# Patient Record
Sex: Female | Born: 1955 | Race: White | Hispanic: No | Marital: Married | Smoking: Never smoker
Health system: Southern US, Community
[De-identification: ages and names within clinical notes are randomized; demographics above are authoritative.]

## PROBLEM LIST (undated history)

## (undated) DIAGNOSIS — M1990 Unspecified osteoarthritis, unspecified site: Secondary | ICD-10-CM

## (undated) DIAGNOSIS — S76119A Strain of unspecified quadriceps muscle, fascia and tendon, initial encounter: Secondary | ICD-10-CM

## (undated) DIAGNOSIS — H269 Unspecified cataract: Secondary | ICD-10-CM

## (undated) DIAGNOSIS — M279 Disease of jaws, unspecified: Secondary | ICD-10-CM

## (undated) HISTORY — PX: CERVICAL CONIZATION W/BX: SHX1330

## (undated) HISTORY — PX: TONSILLECTOMY: SUR1361

---

## 2000-11-06 DIAGNOSIS — C4491 Basal cell carcinoma of skin, unspecified: Secondary | ICD-10-CM

## 2000-11-06 HISTORY — DX: Basal cell carcinoma of skin, unspecified: C44.91

## 2011-08-01 ENCOUNTER — Ambulatory Visit: Payer: Self-pay | Admitting: Family Medicine

## 2012-02-19 ENCOUNTER — Other Ambulatory Visit: Payer: Self-pay | Admitting: Orthopedic Surgery

## 2012-02-20 ENCOUNTER — Encounter (HOSPITAL_BASED_OUTPATIENT_CLINIC_OR_DEPARTMENT_OTHER): Payer: Self-pay | Admitting: *Deleted

## 2012-02-23 ENCOUNTER — Encounter (HOSPITAL_BASED_OUTPATIENT_CLINIC_OR_DEPARTMENT_OTHER): Payer: Self-pay | Admitting: Anesthesiology

## 2012-02-23 ENCOUNTER — Ambulatory Visit (HOSPITAL_BASED_OUTPATIENT_CLINIC_OR_DEPARTMENT_OTHER): Payer: Worker's Compensation | Admitting: Anesthesiology

## 2012-02-23 ENCOUNTER — Ambulatory Visit (HOSPITAL_BASED_OUTPATIENT_CLINIC_OR_DEPARTMENT_OTHER)
Admission: RE | Admit: 2012-02-23 | Discharge: 2012-02-23 | Disposition: A | Discharge status: Home / Self Care | Payer: Worker's Compensation | Source: Ambulatory Visit | Attending: Orthopedic Surgery | Admitting: Orthopedic Surgery

## 2012-02-23 ENCOUNTER — Encounter (HOSPITAL_BASED_OUTPATIENT_CLINIC_OR_DEPARTMENT_OTHER): Payer: Self-pay | Admitting: *Deleted

## 2012-02-23 ENCOUNTER — Encounter (HOSPITAL_BASED_OUTPATIENT_CLINIC_OR_DEPARTMENT_OTHER)
Admission: RE | Disposition: A | Discharge status: Home / Self Care | Payer: Self-pay | Source: Ambulatory Visit | Attending: Orthopedic Surgery

## 2012-02-23 DIAGNOSIS — M2240 Chondromalacia patellae, unspecified knee: Secondary | ICD-10-CM | POA: Insufficient documentation

## 2012-02-23 DIAGNOSIS — M23302 Other meniscus derangements, unspecified lateral meniscus, unspecified knee: Secondary | ICD-10-CM | POA: Insufficient documentation

## 2012-02-23 HISTORY — PX: KNEE ARTHROSCOPY: SHX127

## 2012-02-23 SURGERY — ARTHROSCOPY, KNEE
Anesthesia: General | Site: Knee | Laterality: Right | Wound class: Clean

## 2012-02-23 MED ORDER — ONDANSETRON HCL 4 MG/2ML IJ SOLN
INTRAMUSCULAR | Status: DC | PRN
  Administered 2012-02-23: 4 mg via INTRAVENOUS

## 2012-02-23 MED ORDER — MORPHINE SULFATE 4 MG/ML IJ SOLN: 0.0500 mg/kg | INTRAMUSCULAR | Status: DC | PRN

## 2012-02-23 MED ORDER — LIDOCAINE HCL (CARDIAC) 20 MG/ML IV SOLN
INTRAVENOUS | Status: DC | PRN
  Administered 2012-02-23: 100 mg via INTRAVENOUS

## 2012-02-23 MED ORDER — OXYCODONE-ACETAMINOPHEN 5-325 MG PO TABS: 1.0000 | ORAL_TABLET | Freq: Four times a day (QID) | ORAL | Status: AC | PRN

## 2012-02-23 MED ORDER — OXYCODONE-ACETAMINOPHEN 5-325 MG PO TABS
1.0000 | ORAL_TABLET | ORAL | Status: DC | PRN
  Administered 2012-02-23: 1 via ORAL

## 2012-02-23 MED ORDER — CEFAZOLIN SODIUM-DEXTROSE 2-3 GM-% IV SOLR: 2.0000 g | INTRAVENOUS | Status: DC

## 2012-02-23 MED ORDER — DROPERIDOL 2.5 MG/ML IJ SOLN
INTRAMUSCULAR | Status: DC | PRN
  Administered 2012-02-23: 0.625 mg via INTRAVENOUS

## 2012-02-23 MED ORDER — POVIDONE-IODINE 7.5 % EX SOLN: Freq: Once | CUTANEOUS | Status: DC

## 2012-02-23 MED ORDER — LACTATED RINGERS IV SOLN
INTRAVENOUS | Status: DC
  Administered 2012-02-23: 08:00:00 via INTRAVENOUS

## 2012-02-23 MED ORDER — FENTANYL CITRATE 0.05 MG/ML IJ SOLN
INTRAMUSCULAR | Status: DC | PRN
  Administered 2012-02-23 (×2): 50 ug via INTRAVENOUS

## 2012-02-23 MED ORDER — DEXAMETHASONE SODIUM PHOSPHATE 4 MG/ML IJ SOLN
INTRAMUSCULAR | Status: DC | PRN
  Administered 2012-02-23: 10 mg via INTRAVENOUS

## 2012-02-23 MED ORDER — MIDAZOLAM HCL 5 MG/5ML IJ SOLN
INTRAMUSCULAR | Status: DC | PRN
  Administered 2012-02-23: 2 mg via INTRAVENOUS

## 2012-02-23 MED ORDER — BUPIVACAINE HCL (PF) 0.5 % IJ SOLN
INTRAMUSCULAR | Status: DC | PRN
  Administered 2012-02-23: 20 mL

## 2012-02-23 MED ORDER — HYDROMORPHONE HCL PF 1 MG/ML IJ SOLN
0.2500 mg | INTRAMUSCULAR | Status: DC | PRN
  Administered 2012-02-23: 0.5 mg via INTRAVENOUS

## 2012-02-23 MED ORDER — PROPOFOL 10 MG/ML IV EMUL
INTRAVENOUS | Status: DC | PRN
  Administered 2012-02-23: 200 mg via INTRAVENOUS

## 2012-02-23 MED ORDER — METOCLOPRAMIDE HCL 5 MG/ML IJ SOLN: 10.0000 mg | Freq: Once | INTRAMUSCULAR | Status: DC | PRN

## 2012-02-23 MED ORDER — SODIUM CHLORIDE 0.9 % IR SOLN
Status: DC | PRN
  Administered 2012-02-23: 10:00:00

## 2012-02-23 SURGICAL SUPPLY — 39 items
BANDAGE ELASTIC 6 VELCRO ST LF (GAUZE/BANDAGES/DRESSINGS) ×2 IMPLANT
BLADE 4.2CUDA (BLADE) IMPLANT
BLADE GREAT WHITE 4.2 (BLADE) ×2 IMPLANT
CANISTER OMNI JUG 16 LITER (MISCELLANEOUS) ×2 IMPLANT
CANISTER SUCTION 2500CC (MISCELLANEOUS) IMPLANT
CLOTH BEACON ORANGE TIMEOUT ST (SAFETY) ×2 IMPLANT
CUTTER MENISCUS  4.2MM (BLADE)
CUTTER MENISCUS 4.2MM (BLADE) IMPLANT
DRAPE ARTHROSCOPY W/POUCH 114 (DRAPES) ×2 IMPLANT
DRSG EMULSION OIL 3X3 NADH (GAUZE/BANDAGES/DRESSINGS) ×2 IMPLANT
DURAPREP 26ML APPLICATOR (WOUND CARE) ×2 IMPLANT
ELECT MENISCUS 165MM 90D (ELECTRODE) ×2 IMPLANT
ELECT REM PT RETURN 9FT ADLT (ELECTROSURGICAL) ×2
ELECTRODE REM PT RTRN 9FT ADLT (ELECTROSURGICAL) ×1 IMPLANT
GLOVE BIOGEL PI IND STRL 8 (GLOVE) ×2 IMPLANT
GLOVE BIOGEL PI INDICATOR 8 (GLOVE) ×2
GLOVE ECLIPSE 6.5 STRL STRAW (GLOVE) ×4 IMPLANT
GLOVE ECLIPSE 7.5 STRL STRAW (GLOVE) ×4 IMPLANT
GOWN BRE IMP PREV XXLGXLNG (GOWN DISPOSABLE) ×2 IMPLANT
GOWN BRE IMP SLV AUR XL STRL (GOWN DISPOSABLE) ×2 IMPLANT
GOWN PREVENTION PLUS XLARGE (GOWN DISPOSABLE) ×4 IMPLANT
GOWN PREVENTION PLUS XXLARGE (GOWN DISPOSABLE) IMPLANT
HOLDER KNEE FOAM BLUE (MISCELLANEOUS) ×2 IMPLANT
KNEE WRAP E Z 3 GEL PACK (MISCELLANEOUS) ×2 IMPLANT
NDL SAFETY ECLIPSE 18X1.5 (NEEDLE) ×1 IMPLANT
NEEDLE HYPO 18GX1.5 SHARP (NEEDLE) ×1
PACK ARTHROSCOPY DSU (CUSTOM PROCEDURE TRAY) ×2 IMPLANT
PACK BASIN DAY SURGERY FS (CUSTOM PROCEDURE TRAY) ×2 IMPLANT
PAD CAST 4YDX4 CTTN HI CHSV (CAST SUPPLIES) ×1 IMPLANT
PADDING CAST COTTON 4X4 STRL (CAST SUPPLIES) ×1
PENCIL BUTTON HOLSTER BLD 10FT (ELECTRODE) ×2 IMPLANT
SET ARTHROSCOPY TUBING (MISCELLANEOUS) ×1
SET ARTHROSCOPY TUBING LN (MISCELLANEOUS) ×1 IMPLANT
SPONGE GAUZE 4X4 12PLY (GAUZE/BANDAGES/DRESSINGS) ×2 IMPLANT
SUT ETHILON 4 0 PS 2 18 (SUTURE) IMPLANT
SYR 5ML LL (SYRINGE) ×2 IMPLANT
TOWEL OR 17X24 6PK STRL BLUE (TOWEL DISPOSABLE) ×2 IMPLANT
TOWEL OR NON WOVEN STRL DISP B (DISPOSABLE) ×2 IMPLANT
WATER STERILE IRR 1000ML POUR (IV SOLUTION) ×2 IMPLANT

## 2012-02-23 NOTE — Anesthesia Postprocedure Evaluation (Signed)
Anesthesia Post Note  Patient: Barbara Rivers  Procedure(s) Performed: Procedure(s) (LRB): ARTHROSCOPY KNEE (Right)  Anesthesia type: General  Patient location: PACU  Post pain: Pain level controlled  Post assessment: Patient's Cardiovascular Status Stable  Last Vitals:  Filed Vitals:   02/23/12 1145  BP: 135/77  Pulse: 89  Temp:   Resp: 20    Post vital signs: Reviewed and stable  Level of consciousness: alert  Complications: No apparent anesthesia complications

## 2012-02-23 NOTE — Anesthesia Procedure Notes (Signed)
Procedure Name: LMA Insertion Date/Time: 02/23/2012 10:00 AM Performed by: Zenia Resides D Pre-anesthesia Checklist: Patient identified, Emergency Drugs available, Suction available, Patient being monitored and Timeout performed Patient Re-evaluated:Patient Re-evaluated prior to inductionOxygen Delivery Method: Circle System Utilized Preoxygenation: Pre-oxygenation with 100% oxygen Intubation Type: IV induction Ventilation: Mask ventilation without difficulty LMA: LMA with gastric port inserted LMA Size: 4.0 Number of attempts: 1 Placement Confirmation: positive ETCO2 and breath sounds checked- equal and bilateral Tube secured with: Tape Dental Injury: Teeth and Oropharynx as per pre-operative assessment

## 2012-02-23 NOTE — Transfer of Care (Signed)
Immediate Anesthesia Transfer of Care Note  Patient: Barbara Rivers  Procedure(s) Performed: Procedure(s) (LRB): ARTHROSCOPY KNEE (Right)  Patient Location: PACU  Anesthesia Type: General  Level of Consciousness: awake, alert  and oriented  Airway & Oxygen Therapy: Patient Spontanous Breathing and Patient connected to face mask oxygen  Post-op Assessment: Report given to PACU RN and Post -op Vital signs reviewed and stable  Post vital signs: Reviewed and stable  Complications: No apparent anesthesia complications

## 2012-02-23 NOTE — Discharge Instructions (Signed)

## 2012-02-23 NOTE — Anesthesia Preprocedure Evaluation (Signed)
Anesthesia Evaluation  Patient identified by MRN, date of birth, ID band Patient awake    Reviewed: Allergy & Precautions, H&P , NPO status , Patient's Chart, lab work & pertinent test results, reviewed documented beta blocker date and time   Airway Mallampati: II TM Distance: >3 FB Neck ROM: full    Dental   Pulmonary neg pulmonary ROS,          Cardiovascular negative cardio ROS      Neuro/Psych negative neurological ROS  negative psych ROS   GI/Hepatic negative GI ROS, Neg liver ROS,   Endo/Other  negative endocrine ROS  Renal/GU negative Renal ROS  negative genitourinary   Musculoskeletal   Abdominal   Peds  Hematology negative hematology ROS (+)   Anesthesia Other Findings See surgeon's H&P   Reproductive/Obstetrics negative OB ROS                           Anesthesia Physical Anesthesia Plan  ASA: I  Anesthesia Plan: General   Post-op Pain Management:    Induction: Intravenous  Airway Management Planned: LMA  Additional Equipment:   Intra-op Plan:   Post-operative Plan: Extubation in OR  Informed Consent: I have reviewed the patients History and Physical, chart, labs and discussed the procedure including the risks, benefits and alternatives for the proposed anesthesia with the patient or authorized representative who has indicated his/her understanding and acceptance.     Plan Discussed with: CRNA and Surgeon  Anesthesia Plan Comments:         Anesthesia Quick Evaluation  

## 2012-02-23 NOTE — H&P (Signed)
PREOPERATIVE H&P  Chief Complaint: r. Knee pain  HPI: Barbara Rivers is a 56 y.o. female who presents for evaluation of r. Knee pain. It has been present for 6 mon and has been worsening. She has failed conservative measures. Pain is rated as moderate.  Past Medical History  Diagnosis Date  . No pertinent past medical history    Past Surgical History  Procedure Date  . Tonsillectomy   . Cervical conization w/bx    History   Social History  . Marital Status: Married    Spouse Name: N/A    Number of Children: N/A  . Years of Education: N/A   Social History Main Topics  . Smoking status: Never Smoker   . Smokeless tobacco: None  . Alcohol Use: Yes     rare  . Drug Use: No  . Sexually Active:    Other Topics Concern  . None   Social History Narrative  . None   History reviewed. No pertinent family history. Allergies  Allergen Reactions  . Penicillins Rash   Prior to Admission medications   Not on File     Positive ROS: none  All other systems have been reviewed and were otherwise negative with the exception of those mentioned in the HPI and as above.  Physical Exam: Filed Vitals:   02/23/12 0757  BP: 152/81  Temp: 98.2 F (36.8 C)  Resp: 20    General: Alert, no acute distress Cardiovascular: No pedal edema Respiratory: No cyanosis, no use of accessory musculature GI: No organomegaly, abdomen is soft and non-tender Skin: No lesions in the area of chief complaint Neurologic: Sensation intact distally Psychiatric: Patient is competent for consent with normal mood and affect Lymphatic: No axillary or cervical lymphadenopathy  MUSCULOSKELETAL: R. Knee Lat jt line tender no instability. +mcmurray  Assessment/Plan: lmt right knee Plan for Procedure(s): ARTHROSCOPY KNEE  The risks benefits and alternatives were discussed with the patient including but not limited to the risks of nonoperative treatment, versus surgical intervention including infection,  bleeding, nerve injury, malunion, nonunion, hardware prominence, hardware failure, need for hardware removal, blood clots, cardiopulmonary complications, morbidity, mortality, among others, and they were willing to proceed.  Predicted outcome is good, although there will be at least a six to nine month expected recovery.  Ramonica Grigg L, MD 02/23/2012 9:46 AM

## 2012-02-25 NOTE — Op Note (Signed)
NAME:  KIELI, GOLLADAY                    ACCOUNT NO.:  MEDICAL RECORD NO.:  [id number]  LOCATION:                                 FACILITY:  PHYSICIAN:  Harvie Junior, M.D.        DATE OF BIRTH:  DATE OF PROCEDURE:  02/23/2012 DATE OF DISCHARGE:                              OPERATIVE REPORT   PREOPERATIVE DIAGNOSES: 1. Lateral meniscal tear. 2. Tricompartmental chondromalacia.  POSTOPERATIVE DIAGNOSES: 1. Lateral meniscal tear. 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  PROCEDURE: 1. Arthroscopic partial lateral meniscectomy. 2. Arthroscopic chondroplasty of the medial femoral condyle and     patellofemoral joint. 3. Lateral retinacular release, arthroscopic.  SURGEON:  Harvie Junior, M.D.  ASSISTANT:  Marshia Ly, PA.  ANESTHESIA:  General.  HISTORY:  Mrs. Hollenkamp is a 56 year old female with a history of significant right knee pain.  We did treat her conservatively for a period of time.  Because of failure of conservative care, MRI was obtained and showed she had lateral meniscal tear with chondromalacia of the tricompartmental.  We talked about treatment options and also felt that arthroscopic intervention would be the most appropriate course of action.  After failure of conservative care for greater than 6 months with continued ongoing pain, she was taken to the operating room for this procedure.  PROCEDURE:  The patient was taken to the operating room.  After adequate anesthesia was induced with general anesthetic, the patient was placed supine on the operating table.  The right leg was prepped and draped in usual sterile fashion.  Following this, routine arthroscopic examination of the knee revealed there was an obvious  chondromalacia of the patellofemoral joint and there was dramatic lateral tracking.  I put a spinal needle in the side of the patella and that actually hit the trochlear groove.  This was from the medial side.  So,  it was really tracking at least by a 50% lateral, overhanging on the lateral side.  At that point, we felt that lateral release is going to be appropriate given to try to improve this tracking.  Attention turned medially, there was a chondromalacia of the medial femoral condyle.  The medial meniscus was normal.  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 The attention was then turned laterally, there was an anterior horn lateral meniscus and there was chondromalacia of the lateral tibial plateau, which was debrided.  The attention was turned back up to the patellofemoral joint, where we debrided chondromalacia and then turned into the lateral retinaculum.  The lateral retinacular release was performed from __________ proximal to the patella down to the joint line.  Once it was completed, the patella now tracked dramatically more into the midline.  This chondromalacia was again thoroughly debrided.  The knee was then copiously and thoroughly irrigated and suctioned dry.  The portals were closed with bandage.  A sterile compressive dressing was applied.  The patient was taken to recovery room where she was noted to be in satisfactory condition.  Estimated blood loss for this procedure was none.     Harvie Junior, M.D.  JLG/MEDQ  D:  02/23/2012  T:  02/23/2012  Job:  [id number]

## 2012-02-26 ENCOUNTER — Encounter (HOSPITAL_BASED_OUTPATIENT_CLINIC_OR_DEPARTMENT_OTHER): Payer: Self-pay | Admitting: Orthopedic Surgery

## 2012-02-29 ENCOUNTER — Encounter (HOSPITAL_BASED_OUTPATIENT_CLINIC_OR_DEPARTMENT_OTHER): Payer: Self-pay

## 2012-11-06 DIAGNOSIS — S76119A Strain of unspecified quadriceps muscle, fascia and tendon, initial encounter: Secondary | ICD-10-CM

## 2012-11-06 HISTORY — DX: Strain of unspecified quadriceps muscle, fascia and tendon, initial encounter: S76.119A

## 2012-11-12 ENCOUNTER — Encounter (HOSPITAL_BASED_OUTPATIENT_CLINIC_OR_DEPARTMENT_OTHER): Payer: Self-pay | Admitting: *Deleted

## 2012-11-12 ENCOUNTER — Other Ambulatory Visit: Payer: Self-pay | Admitting: Orthopedic Surgery

## 2012-11-15 ENCOUNTER — Encounter (HOSPITAL_BASED_OUTPATIENT_CLINIC_OR_DEPARTMENT_OTHER): Payer: Self-pay

## 2012-11-15 ENCOUNTER — Ambulatory Visit (HOSPITAL_BASED_OUTPATIENT_CLINIC_OR_DEPARTMENT_OTHER)
Admission: RE | Admit: 2012-11-15 | Discharge: 2012-11-16 | Disposition: A | Discharge status: Home / Self Care | Payer: Worker's Compensation | Source: Ambulatory Visit | Attending: Orthopedic Surgery | Admitting: Orthopedic Surgery

## 2012-11-15 ENCOUNTER — Encounter (HOSPITAL_BASED_OUTPATIENT_CLINIC_OR_DEPARTMENT_OTHER): Payer: Self-pay | Admitting: *Deleted

## 2012-11-15 ENCOUNTER — Encounter (HOSPITAL_BASED_OUTPATIENT_CLINIC_OR_DEPARTMENT_OTHER)
Admission: RE | Disposition: A | Discharge status: Home / Self Care | Payer: Self-pay | Source: Ambulatory Visit | Attending: Orthopedic Surgery

## 2012-11-15 ENCOUNTER — Ambulatory Visit (HOSPITAL_BASED_OUTPATIENT_CLINIC_OR_DEPARTMENT_OTHER): Payer: Worker's Compensation | Admitting: *Deleted

## 2012-11-15 DIAGNOSIS — M66259 Spontaneous rupture of extensor tendons, unspecified thigh: Secondary | ICD-10-CM | POA: Insufficient documentation

## 2012-11-15 HISTORY — PX: QUADRICEPS TENDON REPAIR: SHX756

## 2012-11-15 HISTORY — DX: Strain of unspecified quadriceps muscle, fascia and tendon, initial encounter: S76.119A

## 2012-11-15 HISTORY — DX: Unspecified cataract: H26.9

## 2012-11-15 HISTORY — DX: Unspecified osteoarthritis, unspecified site: M19.90

## 2012-11-15 HISTORY — DX: Disease of jaws, unspecified: M27.9

## 2012-11-15 SURGERY — REPAIR, TENDON, QUADRICEPS
Anesthesia: Regional | Site: Knee | Laterality: Right | Wound class: Clean

## 2012-11-15 MED ORDER — CLINDAMYCIN PHOSPHATE 600 MG/50ML IV SOLN
600.0000 mg | Freq: Four times a day (QID) | INTRAVENOUS | Status: AC
  Administered 2012-11-15 – 2012-11-16 (×2): 600 mg via INTRAVENOUS

## 2012-11-15 MED ORDER — OXYCODONE-ACETAMINOPHEN 5-325 MG PO TABS: 1.0000 | ORAL_TABLET | ORAL | Status: DC | PRN

## 2012-11-15 MED ORDER — WARFARIN SODIUM 2.5 MG PO TABS: ORAL_TABLET | ORAL | Status: DC

## 2012-11-15 MED ORDER — METHOCARBAMOL 100 MG/ML IJ SOLN: 500.0000 mg | Freq: Four times a day (QID) | INTRAMUSCULAR | Status: DC | PRN

## 2012-11-15 MED ORDER — ONDANSETRON HCL 4 MG/2ML IJ SOLN
INTRAMUSCULAR | Status: DC | PRN
  Administered 2012-11-15: 4 mg via INTRAVENOUS

## 2012-11-15 MED ORDER — METHOCARBAMOL 500 MG PO TABS
500.0000 mg | ORAL_TABLET | Freq: Four times a day (QID) | ORAL | Status: DC | PRN
  Administered 2012-11-15 – 2012-11-16 (×2): 500 mg via ORAL

## 2012-11-15 MED ORDER — OXYCODONE HCL 5 MG PO TABS: 5.0000 mg | ORAL_TABLET | Freq: Once | ORAL | Status: AC | PRN

## 2012-11-15 MED ORDER — FENTANYL CITRATE 0.05 MG/ML IJ SOLN
50.0000 ug | INTRAMUSCULAR | Status: DC | PRN
  Administered 2012-11-15: 100 ug via INTRAVENOUS

## 2012-11-15 MED ORDER — SODIUM CHLORIDE 0.9 % IV SOLN
INTRAVENOUS | Status: DC
  Administered 2012-11-15: 75 mL/h via INTRAVENOUS

## 2012-11-15 MED ORDER — HYDROMORPHONE HCL PF 1 MG/ML IJ SOLN: 1.0000 mg | INTRAMUSCULAR | Status: DC | PRN

## 2012-11-15 MED ORDER — MIDAZOLAM HCL 2 MG/2ML IJ SOLN
1.0000 mg | INTRAMUSCULAR | Status: DC | PRN
  Administered 2012-11-15: 2 mg via INTRAVENOUS

## 2012-11-15 MED ORDER — LACTATED RINGERS IV SOLN
INTRAVENOUS | Status: DC
  Administered 2012-11-15: 15:00:00 via INTRAVENOUS
  Administered 2012-11-15: 20 mL/h via INTRAVENOUS
  Administered 2012-11-15: 13:00:00 via INTRAVENOUS
  Administered 2012-11-15: 20 mL/h via INTRAVENOUS

## 2012-11-15 MED ORDER — ACETAMINOPHEN 10 MG/ML IV SOLN
1000.0000 mg | Freq: Once | INTRAVENOUS | Status: AC
  Administered 2012-11-15: 1000 mg via INTRAVENOUS

## 2012-11-15 MED ORDER — CLINDAMYCIN PHOSPHATE 900 MG/50ML IV SOLN
900.0000 mg | INTRAVENOUS | Status: AC
  Administered 2012-11-15: 900 mg via INTRAVENOUS

## 2012-11-15 MED ORDER — ONDANSETRON HCL 4 MG/2ML IJ SOLN
4.0000 mg | Freq: Four times a day (QID) | INTRAMUSCULAR | Status: DC | PRN
  Administered 2012-11-16: 4 mg via INTRAVENOUS

## 2012-11-15 MED ORDER — DEXAMETHASONE SODIUM PHOSPHATE 10 MG/ML IJ SOLN
INTRAMUSCULAR | Status: DC | PRN
  Administered 2012-11-15: 10 mg via INTRAVENOUS

## 2012-11-15 MED ORDER — HYDROMORPHONE HCL PF 1 MG/ML IJ SOLN
0.2500 mg | INTRAMUSCULAR | Status: DC | PRN
  Administered 2012-11-15 (×3): 0.5 mg via INTRAVENOUS

## 2012-11-15 MED ORDER — PROMETHAZINE HCL 25 MG/ML IJ SOLN: 6.2500 mg | Freq: Once | INTRAMUSCULAR | Status: DC

## 2012-11-15 MED ORDER — BUPIVACAINE-EPINEPHRINE PF 0.5-1:200000 % IJ SOLN
INTRAMUSCULAR | Status: DC | PRN
  Administered 2012-11-15: 30 mL

## 2012-11-15 MED ORDER — LIDOCAINE HCL (CARDIAC) 20 MG/ML IV SOLN
INTRAVENOUS | Status: DC | PRN
  Administered 2012-11-15: 40 mg via INTRAVENOUS

## 2012-11-15 MED ORDER — ACETAMINOPHEN 10 MG/ML IV SOLN
1000.0000 mg | Freq: Four times a day (QID) | INTRAVENOUS | Status: AC
  Administered 2012-11-15 – 2012-11-16 (×2): 1000 mg via INTRAVENOUS

## 2012-11-15 MED ORDER — FENTANYL CITRATE 0.05 MG/ML IJ SOLN
INTRAMUSCULAR | Status: DC | PRN
  Administered 2012-11-15: 100 ug via INTRAVENOUS

## 2012-11-15 MED ORDER — ONDANSETRON HCL 4 MG PO TABS: 4.0000 mg | ORAL_TABLET | Freq: Four times a day (QID) | ORAL | Status: DC | PRN

## 2012-11-15 MED ORDER — POVIDONE-IODINE 7.5 % EX SOLN: Freq: Once | CUTANEOUS | Status: DC

## 2012-11-15 MED ORDER — WARFARIN SODIUM 7.5 MG PO TABS: 7.5000 mg | ORAL_TABLET | Freq: Once | ORAL | Status: DC

## 2012-11-15 MED ORDER — ZOLPIDEM TARTRATE 5 MG PO TABS: 5.0000 mg | ORAL_TABLET | Freq: Every evening | ORAL | Status: DC | PRN

## 2012-11-15 MED ORDER — PROPOFOL 10 MG/ML IV BOLUS
INTRAVENOUS | Status: DC | PRN
  Administered 2012-11-15: 200 mg via INTRAVENOUS

## 2012-11-15 MED ORDER — OXYCODONE HCL 5 MG/5ML PO SOLN: 5.0000 mg | Freq: Once | ORAL | Status: AC | PRN

## 2012-11-15 MED ORDER — OXYCODONE HCL 5 MG PO TABS
5.0000 mg | ORAL_TABLET | ORAL | Status: DC | PRN
Start: 2012-11-15 — End: 2012-11-16
  Administered 2012-11-15: 10 mg via ORAL
  Administered 2012-11-16: 5 mg via ORAL
  Administered 2012-11-16: 10 mg via ORAL

## 2012-11-15 SURGICAL SUPPLY — 69 items
BANDAGE ELASTIC 6 VELCRO ST LF (GAUZE/BANDAGES/DRESSINGS) ×2 IMPLANT
BANDAGE ESMARK 6X9 LF (GAUZE/BANDAGES/DRESSINGS) ×1 IMPLANT
BLADE SURG 15 STRL LF DISP TIS (BLADE) ×2 IMPLANT
BLADE SURG 15 STRL SS (BLADE) ×2
BNDG ESMARK 6X9 LF (GAUZE/BANDAGES/DRESSINGS) ×2
CANISTER SUCTION 1200CC (MISCELLANEOUS) ×2 IMPLANT
DECANTER SPIKE VIAL GLASS SM (MISCELLANEOUS) IMPLANT
DRAPE EXTREMITY T 121X128X90 (DRAPE) ×2 IMPLANT
DRAPE U-SHAPE 47X51 STRL (DRAPES) IMPLANT
DRSG PAD ABDOMINAL 8X10 ST (GAUZE/BANDAGES/DRESSINGS) IMPLANT
DURAPREP 26ML APPLICATOR (WOUND CARE) ×2 IMPLANT
ELECT REM PT RETURN 9FT ADLT (ELECTROSURGICAL) ×2
ELECTRODE REM PT RTRN 9FT ADLT (ELECTROSURGICAL) ×1 IMPLANT
GAUZE XEROFORM 1X8 LF (GAUZE/BANDAGES/DRESSINGS) ×2 IMPLANT
GLOVE BIO SURGEON STRL SZ 6.5 (GLOVE) ×2 IMPLANT
GLOVE BIO SURGEON STRL SZ7 (GLOVE) ×2 IMPLANT
GLOVE BIOGEL PI IND STRL 7.0 (GLOVE) ×1 IMPLANT
GLOVE BIOGEL PI IND STRL 7.5 (GLOVE) ×1 IMPLANT
GLOVE BIOGEL PI IND STRL 8 (GLOVE) ×3 IMPLANT
GLOVE BIOGEL PI INDICATOR 7.0 (GLOVE) ×1
GLOVE BIOGEL PI INDICATOR 7.5 (GLOVE) ×1
GLOVE BIOGEL PI INDICATOR 8 (GLOVE) ×3
GLOVE ECLIPSE 7.5 STRL STRAW (GLOVE) ×4 IMPLANT
GLOVE INDICATOR 8.0 STRL GRN (GLOVE) IMPLANT
GOWN BRE IMP PREV XXLGXLNG (GOWN DISPOSABLE) ×2 IMPLANT
GOWN PREVENTION PLUS XLARGE (GOWN DISPOSABLE) ×4 IMPLANT
GOWN PREVENTION PLUS XXLARGE (GOWN DISPOSABLE) ×4 IMPLANT
IMMOBILIZER KNEE 24 THIGH 36 (MISCELLANEOUS) ×1 IMPLANT
IMMOBILIZER KNEE 24 UNIV (MISCELLANEOUS) ×2
KNEE WRAP E Z 3 GEL PACK (MISCELLANEOUS) IMPLANT
NDL SUT 6 .5 CRC .975X.05 MAYO (NEEDLE) IMPLANT
NEEDLE MAYO TAPER (NEEDLE)
NEEDLE MAYO TROCAR (NEEDLE) ×2 IMPLANT
NS IRRIG 1000ML POUR BTL (IV SOLUTION) ×2 IMPLANT
PACK ARTHROSCOPY DSU (CUSTOM PROCEDURE TRAY) ×2 IMPLANT
PACK BASIN DAY SURGERY FS (CUSTOM PROCEDURE TRAY) ×2 IMPLANT
PADDING CAST ABS 4INX4YD NS (CAST SUPPLIES) ×1
PADDING CAST ABS 6INX4YD NS (CAST SUPPLIES)
PADDING CAST ABS COTTON 4X4 ST (CAST SUPPLIES) ×1 IMPLANT
PADDING CAST ABS COTTON 6X4 NS (CAST SUPPLIES) IMPLANT
PADDING CAST COTTON 6X4 STRL (CAST SUPPLIES) ×2 IMPLANT
PASSER SUT SWANSON 36MM LOOP (INSTRUMENTS) ×2 IMPLANT
PENCIL BUTTON HOLSTER BLD 10FT (ELECTRODE) ×2 IMPLANT
SLEEVE SURGEON STRL (DRAPES) ×2 IMPLANT
SPONGE GAUZE 4X4 12PLY (GAUZE/BANDAGES/DRESSINGS) ×2 IMPLANT
SPONGE LAP 4X18 X RAY DECT (DISPOSABLE) ×4 IMPLANT
STAPLER VISISTAT 35W (STAPLE) IMPLANT
SUT 2 FIBERLOOP 20 STRT BLUE (SUTURE) ×2
SUT ETHIBOND 5 LR DA (SUTURE) IMPLANT
SUT FIBERWIRE #2 38 T-5 BLUE (SUTURE) ×8
SUT FIBERWIRE #5 38 CONV NDL (SUTURE)
SUT MNCRL AB 3-0 PS2 18 (SUTURE) ×2 IMPLANT
SUT VIC AB 0 CT1 27 (SUTURE) ×3
SUT VIC AB 0 CT1 27XBRD ANBCTR (SUTURE) ×3 IMPLANT
SUT VIC AB 1 CT1 27 (SUTURE) ×1
SUT VIC AB 1 CT1 27XBRD ANBCTR (SUTURE) ×1 IMPLANT
SUT VIC AB 2-0 CT1 27 (SUTURE)
SUT VIC AB 2-0 CT1 TAPERPNT 27 (SUTURE) IMPLANT
SUT VIC AB 2-0 SH 27 (SUTURE) ×1
SUT VIC AB 2-0 SH 27XBRD (SUTURE) ×1 IMPLANT
SUTURE 2 FIBERLOOP 20 STRT BLU (SUTURE) ×1 IMPLANT
SUTURE FIBERWR #2 38 T-5 BLUE (SUTURE) ×4 IMPLANT
SUTURE FIBERWR #5 38 CONV NDL (SUTURE) IMPLANT
SYR BULB 3OZ (MISCELLANEOUS) ×2 IMPLANT
TENDON ANTERIOR TIBIALIS (Tissue) ×2 IMPLANT
TOWEL OR 17X24 6PK STRL BLUE (TOWEL DISPOSABLE) ×4 IMPLANT
UNDERPAD 30X30 INCONTINENT (UNDERPADS AND DIAPERS) ×2 IMPLANT
WATER STERILE IRR 1000ML POUR (IV SOLUTION) IMPLANT
YANKAUER SUCT BULB TIP NO VENT (SUCTIONS) ×2 IMPLANT

## 2012-11-15 NOTE — Brief Op Note (Signed)
11/15/2012  5:22 PM  PATIENT:  Barbara Rivers  57 y.o. female  PRE-OPERATIVE DIAGNOSIS:  QUADRACEP RUPTURE RIGHT KNEE  POST-OPERATIVE DIAGNOSIS:  QUADRACEP RUPTURE RIGHT KNEE  PROCEDURE:  Procedure(s) (LRB) with comments: REPAIR QUADRICEP TENDON (Right) - WITH ALLOGRAFT  SURGEON:  Surgeon(s) and Role:    * Harvie Junior, MD - Primary  PHYSICIAN ASSISTANT:   ASSISTANTS: bethune   ANESTHESIA:   general  EBL:  Total I/O In: 1000 [I.V.:1000] Out: -   BLOOD ADMINISTERED:none  DRAINS: none   LOCAL MEDICATIONS USED:  MARCAINE     SPECIMEN:  No Specimen  DISPOSITION OF SPECIMEN:  N/A  COUNTS:  YES  TOURNIQUET:   Total Tourniquet Time Documented: Thigh (Right) - 123 minutes  DICTATION: .Other Dictation: Dictation Number [id number]  PLAN OF CARE: Admit for overnight observation  PATIENT DISPOSITION:  PACU - hemodynamically stable.   Delay start of Pharmacological VTE agent (>24hrs) due to surgical blood loss or risk of bleeding: no

## 2012-11-15 NOTE — Anesthesia Procedure Notes (Addendum)
Anesthesia Regional Block:  Femoral nerve block  Pre-Anesthetic Checklist: ,, timeout performed, Correct Patient, Correct Site, Correct Laterality, Correct Procedure, Correct Position, site marked, Risks and benefits discussed, pre-op evaluation,  At surgeon's request and post-op pain management  Laterality: Right  Prep: Maximum Sterile Barrier Precautions used and chloraprep       Needles:  Injection technique: Single-shot  Needle Type: Echogenic Stimulator Needle      Needle Gauge: 22 and 22 G    Additional Needles:  Procedures: ultrasound guided (picture in chart) and nerve stimulator Femoral nerve block  Nerve Stimulator or Paresthesia:  Response: Patellar respose, 0.4 mA,   Additional Responses:   Narrative:  Start time: 11/15/2012 2:47 PM End time: 11/15/2012 2:52 PM Injection made incrementally with aspirations every 5 mL. Anesthesiologist: Fitzgerald,MD  Additional Notes: 2% Lidocaine skin wheel.   Femoral nerve block Procedure Name: LMA Insertion Date/Time: 11/15/2012 2:35 PM Performed by: Suann Larry WOLFE Pre-anesthesia Checklist: Patient identified, Emergency Drugs available, Suction available and Patient being monitored Patient Re-evaluated:Patient Re-evaluated prior to inductionOxygen Delivery Method: Circle System Utilized Preoxygenation: Pre-oxygenation with 100% oxygen Intubation Type: IV induction Ventilation: Mask ventilation without difficulty LMA: LMA inserted LMA Size: 4.0 Number of attempts: 1 Airway Equipment and Method: bite block Placement Confirmation: positive ETCO2 and breath sounds checked- equal and bilateral Tube secured with: Tape Dental Injury: Teeth and Oropharynx as per pre-operative assessment

## 2012-11-15 NOTE — Anesthesia Preprocedure Evaluation (Addendum)
Anesthesia Evaluation  Patient identified by MRN, date of birth, ID band Patient awake    Reviewed: Allergy & Precautions, H&P , NPO status , Patient's Chart, lab work & pertinent test results  Airway Mallampati: II  Neck ROM: Full    Dental No notable dental hx. (+) Teeth Intact and Dental Advisory Given   Pulmonary neg pulmonary ROS,  breath sounds clear to auscultation  Pulmonary exam normal       Cardiovascular negative cardio ROS  Rhythm:Regular Rate:Normal     Neuro/Psych negative neurological ROS  negative psych ROS   GI/Hepatic negative GI ROS, Neg liver ROS,   Endo/Other  negative endocrine ROS  Renal/GU negative Renal ROS  negative genitourinary   Musculoskeletal   Abdominal   Peds  Hematology negative hematology ROS (+)   Anesthesia Other Findings   Reproductive/Obstetrics negative OB ROS                           Anesthesia Physical Anesthesia Plan  ASA: I  Anesthesia Plan: General and Regional   Post-op Pain Management:    Induction: Intravenous  Airway Management Planned: LMA  Additional Equipment:   Intra-op Plan:   Post-operative Plan: Extubation in OR  Informed Consent: I have reviewed the patients History and Physical, chart, labs and discussed the procedure including the risks, benefits and alternatives for the proposed anesthesia with the patient or authorized representative who has indicated his/her understanding and acceptance.   Dental advisory given  Plan Discussed with: CRNA  Anesthesia Plan Comments:         Anesthesia Quick Evaluation

## 2012-11-15 NOTE — Transfer of Care (Signed)
Immediate Anesthesia Transfer of Care Note  Patient: Barbara Rivers  Procedure(s) Performed: Procedure(s) (LRB) with comments: REPAIR QUADRICEP TENDON (Right) - WITH ALLOGRAFT  Patient Location: PACU  Anesthesia Type:General and Regional  Level of Consciousness: awake and alert   Airway & Oxygen Therapy: Patient Spontanous Breathing and Patient connected to face mask oxygen  Post-op Assessment: Report given to PACU RN and Post -op Vital signs reviewed and stable  Post vital signs: Reviewed and stable  Complications: No apparent anesthesia complications

## 2012-11-15 NOTE — Progress Notes (Signed)
Assisted Dr. Fitzgerald with right, ultrasound guided, femoral block. Side rails up, monitors on throughout procedure. See vital signs in flow sheet. Tolerated Procedure well. 

## 2012-11-15 NOTE — Anesthesia Postprocedure Evaluation (Signed)
  Anesthesia Post-op Note  Patient: Barbara Rivers  Procedure(s) Performed: Procedure(s) (LRB) with comments: REPAIR QUADRICEP TENDON (Right) - WITH ALLOGRAFT  Patient Location: PACU  Anesthesia Type:GA combined with regional for post-op pain  Level of Consciousness: awake  Airway and Oxygen Therapy: Patient Spontanous Breathing  Post-op Pain: mild  Post-op Assessment: Post-op Vital signs reviewed, Patient's Cardiovascular Status Stable, Respiratory Function Stable, Patent Airway and No signs of Nausea or vomiting  Post-op Vital Signs: Reviewed and stable  Complications: No apparent anesthesia complications

## 2012-11-15 NOTE — H&P (Signed)
PREOPERATIVE H&P  Chief Complaint: r knee weakness and pain  HPI: Barbara Rivers is a 57 y.o. female who presents for evaluation of r knee pain and weakness. It has been present for 1 year and has been worsening. She has failed conservative measures. Pain is rated as moderate.  Past Medical History  Diagnosis Date  . Quadriceps tendon rupture 11/2012    right  . Arthritis     right knee  . Immature cataract     left eye  . Disorder of jaw     occ. becomes tight and locks with dental procedures   Past Surgical History  Procedure Date  . Tonsillectomy   . Cervical conization w/bx   . Knee arthroscopy 02/23/2012    Procedure: ARTHROSCOPY KNEE;  Surgeon: Harvie Junior, MD;  Location: [facility];  Service: Orthopedics;  Laterality: Right;  Lateral Retinacular Release, Chondroplasty Medial Femoral Condyle and Patella, Partial Lateral Meniscectomy   History   Social History  . Marital Status: Married    Spouse Name: N/A    Number of Children: N/A  . Years of Education: N/A   Social History Main Topics  . Smoking status: Never Smoker   . Smokeless tobacco: Never Used  . Alcohol Use: Yes     Comment: occasionally  . Drug Use: No  . Sexually Active:    Other Topics Concern  . None   Social History Narrative  . None   History reviewed. No pertinent family history. Allergies  Allergen Reactions  . Penicillins Hives   Prior to Admission medications   Medication Sig Start Date End Date Taking? Authorizing Provider  cholecalciferol (VITAMIN D) 1000 UNITS tablet Take 1,000 Units by mouth daily.   Yes Historical Provider, MD  cyclobenzaprine (FLEXERIL) 10 MG tablet Take 10 mg by mouth 3 (three) times daily as needed.   Yes Historical Provider, MD  ibuprofen (ADVIL,MOTRIN) 200 MG tablet Take 200 mg by mouth every 6 (six) hours as needed.   Yes Historical Provider, MD  l-methylfolate-B6-B12 (METANX) 3-35-2 MG TABS Take 1 tablet by mouth daily.   Yes Historical  Provider, MD  Multiple Vitamin (MULTIVITAMIN) tablet Take 1 tablet by mouth daily.   Yes Historical Provider, MD     Positive ROS: none  All other systems have been reviewed and were otherwise negative with the exception of those mentioned in the HPI and as above.  Physical Exam: Filed Vitals:   11/15/12 1234  BP: 151/82  Pulse: 86  Temp: 98.2 F (36.8 C)  Resp: 20    General: Alert, no acute distress Cardiovascular: No pedal edema Respiratory: No cyanosis, no use of accessory musculature GI: No organomegaly, abdomen is soft and non-tender Skin: No lesions in the area of chief complaint Neurologic: Sensation intact distally Psychiatric: Patient is competent for consent with normal mood and affect Lymphatic: No axillary or cervical lymphadenopathy  MUSCULOSKELETAL: r knee _SLR and weakness of lifting leg.  Mild palp gap  Assessment/Plan: QUADRACEP RUPTURE RIGHT KNEE Plan for Procedure(s): REPAIR QUADRICEP TENDON  The risks benefits and alternatives were discussed with the patient including but not limited to the risks of nonoperative treatment, versus surgical intervention including infection, bleeding, nerve injury, malunion, nonunion, hardware prominence, hardware failure, need for hardware removal, blood clots, cardiopulmonary complications, morbidity, mortality, among others, and they were willing to proceed.  Predicted outcome is good, although there will be at least a six to nine month expected recovery.  Harvie Junior, MD 11/15/2012 1:49 PM

## 2012-11-18 ENCOUNTER — Encounter (HOSPITAL_BASED_OUTPATIENT_CLINIC_OR_DEPARTMENT_OTHER): Payer: Self-pay | Admitting: Orthopedic Surgery

## 2012-11-18 NOTE — Op Note (Signed)
NAME:  Barbara Rivers, Barbara Rivers               ACCOUNT NO.:  [id number]  MEDICAL RECORD NO.:  [id number]  LOCATION:                                 FACILITY:  PHYSICIAN:  Harvie Junior, M.D.   DATE OF BIRTH:  1955-11-16  DATE OF PROCEDURE:  11/15/2012 DATE OF DISCHARGE:                              OPERATIVE REPORT   PREOPERATIVE DIAGNOSIS:  Chronic intratendinous quadriceps tendon rupture, right.  POSTOPERATIVE DIAGNOSIS:  Chronic intratendinous quadriceps tendon rupture, right.  PROCEDURE: 1. Open end-to-end repair of chronic biceps tendon rupture. 2. Allograft reconstruction of open biceps tendon rupture.  SURGEON:  Harvie Junior, M.D.  ASSISTANT:  Marshia Ly, PA  ANESTHESIA:  General.  BRIEF HISTORY:  Ms. Gregg is a 58 year old female with a long history of having had an arthroscopic knee surgery about a year ago.  She did initially fine after the surgery, but had some issues with weakness in regaining quad function.  We had followed her off and on over this long period of time.  The patient had episodes, where she felt like she was really weak in the legs, having difficulty flexing it.  Followed by episodes where she was able to flex the leg.  She was at some point able do a straight leg raise, although was not strong.  She was not able to do this and that off and on her ability to do that would kind of change. Eventually after a long period of time,  we felt that MRI examination of the knee, because she was just not getting better, was appropriate and on that MRI they saw a quadriceps tendon rupture about 2.5 cm above the patella.  At that point, we got another MRI which showed that there was some tendon on the proximal end with about a 5-cm gap, and we felt that even though this was a tremendous gap that given the poor function that she had she would be a candidate for some sort of reconstruction, and we knew going in that we are going to have to be prepared to do some  kind of allograft reconstruction if things just did not come right easily back together and we were certainly prepared to do that.  She was brought to the operating room for this procedure.  DESCRIPTION OF PROCEDURE:  The patient was brought to the operating room.  After adequate anesthesia was obtained with general anesthetic, the patient was placed supine on the operating table.  The right leg was then prepped and draped in the usual sterile fashion.  Following this, the leg was exsanguinated.  Blood pressure tourniquet inflated to 300 mmHg.  Following this, a midline incision was made, subcutaneous tissue down to the level of extensor mechanism, and we were able to identify at this point, a sort of the rectus femoris with its tendinous attachment about 7 or 8 cm proximal to the knee.  We could see at this point, clearly some tendinous attachment to the patella, but there was a large gap of sort of granulation and scar tissue, which has actually healed room fairly nicely in this point.  At this point, we made a longitudinal incision, this to see  exactly what it was.  This really gave access right down to the anterior aspect of the femur and then we excised all this granulation and scar tissue, freshened up the edges of the tendon, passed Krackow stitches through both ends and too see what we could get. At that point, with the tremendous amount of force and energy, we were able to appose the 2 ends of the quadriceps tendon, but really we were not able to get any kind of flexion at that area and I was concerned that there really was not enough of an easy apposition to just leave this as our only repair.  At that point, we were assessing options and I felt that it would be nice to get some sort of anchorage of this proximal portion to bone, which might also give [place] a little bit of pull up of the knee cap which has probably been sort of sagging down some over time.  So, at this point, what  we elected to do was to pass a second Krackow stitch to the proximal piece and then drill holes through the patella in a longitudinal direction to tie that proximal piece down and have a good fixation.  We clearly knew at this point, that we needed to have a backup plan in case all of that tendinous portion failed and I felt that an allograft reconstruction was appropriate.  We passed a guidewire through the central portion of the patella horizontally and then passed an anterior tib allograft side-to-side through the patella, and then went underneath the quad tendon and then figure-of-eight back over the top of the quad tendon, and this certainly gave [place] a very nice tendinous repair, sewed that in right at the portion of the quad where we came up from bottom to top so that tendon together right at that point and then also brought in a figure-of-eight, brought this tendon down to 3, #2 FiberWire repair of tendon to tendon in a figure-of-eight fashion.  Once this was done, we did a little bit of side-to-side repair of the tendon just at the superior portion of the passage to the proximal quadriceps tendon.  At this point, we over sewed the gap with #2 FiberWire and actually had a pretty nice repair of that area.  At that point, we felt that we had done all we could do to try to repair this quadriceps tendon.  We repaired the retinaculum with 0 Vicryl, interrupted.  We had irrigated thoroughly prior to this repair, irrigated the joint thoroughly and at this point, tested the repair we could get to about 30 degrees without tremendous tension on the repair. The tourniquet had been let down at this point, and all bleeding was controlled with electrocautery.  We had encountered her varicose vein underneath the skin, and we tied that off.  At this point, the wound was irrigated again, and suctioned dry, closed in layers and the skin was closed with 3-0 Monocryl subcuticular.  Benzoin and  Steri-Strips were applied at this point, and the patient was taken to recovery room it should be noted in satisfactory condition.  Because of the long nature of the surgery, I felt that overnight observation with antibiotic therapy would be appropriate and I also felt that treating her as we were going to for 4-6 weeks with a perfectly straight leg that we should anticoagulate her and will start her on that anticoagulation tonight. Intraoperatively, we did take an image of the guidewire that we passed for  the anterior tibia, and image of that to make sure that we were in the central portion of the patella.  The other issue that arose was that we were prepared and certainly implanted to do a [place] technique which is a lengthening procedure of the tendon but unfortunately that proximal quadriceps did not have enough width, I felt, to undergo the stress of cutting that in a full-thickness manner, so that really eliminated that which was part of our original plan as the thought process was unfolding during the surgery.  The estimated blood loss for the procedure was really minimal, when the tourniquet was let down, there was minimal bleeding and complications were none.     Harvie Junior, M.D.     Ranae Plumber  D:  11/15/2012  T:  11/16/2012  Job:  [id number]

## 2012-11-23 ENCOUNTER — Other Ambulatory Visit: Payer: Self-pay

## 2012-11-23 LAB — PROTIME-INR
INR: 1.5
Prothrombin Time: 18.3 secs — ABNORMAL HIGH (ref 11.5–14.7)

## 2014-07-21 ENCOUNTER — Encounter: Payer: Self-pay | Admitting: Orthopedic Surgery

## 2014-08-06 ENCOUNTER — Encounter: Payer: Self-pay | Admitting: Orthopedic Surgery

## 2014-09-06 ENCOUNTER — Encounter: Payer: Self-pay | Admitting: Orthopedic Surgery

## 2014-10-06 ENCOUNTER — Encounter: Payer: Self-pay | Admitting: Orthopedic Surgery

## 2016-02-09 ENCOUNTER — Ambulatory Visit: Payer: Self-pay | Admitting: Physical Medicine & Rehabilitation

## 2016-02-14 ENCOUNTER — Encounter: Payer: Worker's Compensation | Admitting: Physical Medicine & Rehabilitation

## 2016-05-31 ENCOUNTER — Encounter: Payer: Self-pay | Admitting: Physical Medicine & Rehabilitation

## 2016-05-31 ENCOUNTER — Encounter
Payer: Worker's Compensation | Attending: Physical Medicine & Rehabilitation | Admitting: Physical Medicine & Rehabilitation

## 2016-05-31 DIAGNOSIS — G8929 Other chronic pain: Secondary | ICD-10-CM | POA: Insufficient documentation

## 2016-05-31 DIAGNOSIS — M1711 Unilateral primary osteoarthritis, right knee: Secondary | ICD-10-CM

## 2016-05-31 DIAGNOSIS — M25561 Pain in right knee: Secondary | ICD-10-CM

## 2016-05-31 DIAGNOSIS — S76111S Strain of right quadriceps muscle, fascia and tendon, sequela: Secondary | ICD-10-CM

## 2016-05-31 DIAGNOSIS — S76119A Strain of unspecified quadriceps muscle, fascia and tendon, initial encounter: Secondary | ICD-10-CM | POA: Insufficient documentation

## 2016-05-31 DIAGNOSIS — G5640 Causalgia of unspecified upper limb: Secondary | ICD-10-CM | POA: Insufficient documentation

## 2016-05-31 DIAGNOSIS — M1731 Unilateral post-traumatic osteoarthritis, right knee: Secondary | ICD-10-CM | POA: Insufficient documentation

## 2016-05-31 DIAGNOSIS — G5641 Causalgia of right upper limb: Secondary | ICD-10-CM

## 2016-05-31 DIAGNOSIS — M62551 Muscle wasting and atrophy, not elsewhere classified, right thigh: Secondary | ICD-10-CM

## 2016-05-31 NOTE — Progress Notes (Signed)
Subjective:               Independent Medical Examination   Patient ID: TIMI PLUFF, female    DOB: 1956-04-28, 60 y.o.   MRN: [id number]                HPI   This is an IME for Mrs. Erskine Squibb. She is a pleasant 60 year old white female who developed right knee pain while working in September of 2012. 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 She developed worsening right knee pain at work along with swelling aftewards. Symptoms did not resolve with conservative management, and ultimately she was referred to Dr. Dorna Leitz whom she saw on 01/09/12.  Dr. Berenice Primas was concerned about a meniscal injury and an MRI was requested. The MRI was performed on 12/21/11 and revealed tricompartmental arthritis and severely torn/macerated anterior horn of the lateral meniscus. Surgery was recommended, and the patient proceeded with arthroscopic partial lateral meniscectomy, chondroplasty of the medial femoral condyle and patellofemoral joint, and lateral retinacular release by Dr. Berenice Primas on 02/13/12.   The patient proceeded with a prolonged course of therapy and care under the direction of Dr. Berenice Primas post-operatively. It was noted that she had ongoing pain and weakness in the knee and quad despite following the direction of her therapist and surgical team. The symptoms proceeded to the point where an EMG/NCS was requested and performed on 09/10/2012 by Dr. Brien Few. The EMG and NCS were both interpreted as normal without signs of any denervation in the thigh(quad). Ultimately an MRI was performed of the right knee/thigh on 10/23/12 which revealed a quadriceps rupture with tendinous gap of 5cm, 2.4cm from the superior patellar pole. Also noted was global atrophy of the right thigh and low level edema in the quadriceps. On 11/15/12 Dr. Berenice Primas performed an open end-to-end repair of quadriceps tendon with allograft  reconstruction. This was a complicated procedure given the chronicity and severity of injury per Dr. Berenice Primas' notes.  The patient participated in numerous courses of outpatient therapy after the second surgery with sub-optimal results. Her last PT session was 10/2014 after which recommendations were made by therapy and Dr Berenice Primas for NuStep trainer and ongoing massage therapy. Apparently neither of these has been approved. Dr. Berenice Primas suggested a consultation with Dr. Carroll Kinds for "sympathetic nerve blockade" at her visit on 02/18/15.  The patient did not see Dr. Isaiah Blakes. At the patient's 04/01/15 visit Dr. Berenice Primas recommended an updated FCE and that the patient be assessed by me for "physical limitations and what type of treatment program (I) would recommend from a physical medicine and rehabilitation standpoint. Her FCE was performed on 05/28/15 at [facility]", and her effort was found to be invalid due to inconsistent performance during testing. However, the patient only completed a small portion of the FCE due to not feeling well, and the FCE was never repeated in its entirety.   Despite running into some barriers regarding treatment/maintenance recommendations, Mrs. Ottey has privately paid for massage therapy which she is still does at present. She feels that the massage helps with edema and gives her temporary pain relief. For exercise and strength she performs short distance walking around her house---she cannot go longer than 10-15 minutes before the pain ensues and the leg swells. She is also doing some walking now in her pool also which  is easier on her neg. She uses an "aqua jogger" belt for suspension and essentially floats and moves her legs. She does do some leg lifts if she can tolerate. She can participate in about 30 minutes of activity before the water activity irritates her knee. Essentially, with any exercise,  She has found that she can go up to the point of fatigue before having  substantial pain as a repercussion.  She has not had formal aquatic therapy.    Since the most recent surgery, she feels that the right patella has "sunk" inferiorly and that her right knee range of motion has been quite limited. Her worst pain is in the distal right quadriceps and around the superior pole of the right patella. Swelling is noted at the right ankle and behind the knee. When she wakes up in the morning the pain tends to be the worst. She has to "ease" in to activity in the morning and wait until she can bear weight. Sometimes she's unable to go down the stairs. When she does go up and down stairs, she has to "side step" one step at a time to safely navigate.  Often when she first gets out of bed, she uses a walker. She generally uses a straight cane most of the time otherwise.    Mrs. Sherrer reports occasional numbness when she walks which is associated with calf tightness and numbness in the right lateral calf/foot. She experiences changes in temperature of the right knee/foot which can happen sporadically and when she's fatigued. She is also unable to tolerate cooler temperatures which worsen pain and heighten the sense of her limb being cool. Her leg often can be hypersensitive to touch.  She can't wear tight fitting clothing and sometimes even her bed sheets can trigger pain in her right knee/leg.  She notes spasms occasionally in the distal right quad. It is more painful to sit with the knee flexed or to bend the knee in general  Her sleep is sporadic. If she happens to fall asleep and the right knee is flexed, she'll eventually wake up with pain in the knee   For pain control she currently uses voltaren gel for her knee 3-4 x daily which provides some local relief. She uses advil 400mg  daily as needed depending upon pain severity----typically at night. She has not tolerated scheduled daily NSAID's in the past due to GI side effects.  She is also admittedly not one to use a lot of  medications in general.  She sometimes uses a lidocaine medi patch when she's able to acquire them. She has never had topical treatments otherwise. A custom compounded cream was apparently recommended at one point.  Lyrica was prescribed for nerve related pain which has helped the severe, "burning" pain in her right leg. She is only taking 150mg  qhs due to swelling/fatigue on the BID dosing.    Pain Inventory Average Pain 7 Pain Right Now 6 My pain is constant, burning, stabbing, tingling and aching  In the last 24 hours, has pain interfered with the following? General activity 8 Relation with others 5 Enjoyment of life 5 What TIME of day is your pain at its worst? all times Sleep (in general) Fair  Pain is worse with: walking, sitting, standing and some activites Pain improves with: rest, heat/ice, medication and TENS Relief from Meds: 5  Mobility use a cane use a walker ability to climb steps?  no do you drive?  no Do you have any goals in  this area?  yes  Function disabled: date disabled 04/2015 I need assistance with the following:  meal prep, household duties and shopping  Neuro/Psych weakness numbness trouble walking spasms confusion anxiety  Prior Studies .  Physicians involved in your care .   Family History  Problem Relation Age of Onset  . Diabetes Father   . Kidney disease Father   . Hypertension Father    Social History   Social History  . Marital status: Married    Spouse name: N/A  . Number of children: N/A  . Years of education: N/A   Social History Main Topics  . Smoking status: Never Smoker  . Smokeless tobacco: Never Used  . Alcohol use Yes     Comment: occasionally  . Drug use: No  . Sexual activity: Not Asked   Other Topics Concern  . None   Social History Narrative  . None   -on Medicare also now Past Surgical History:  Procedure Laterality Date  . CERVICAL CONIZATION W/BX    . KNEE ARTHROSCOPY  02/23/2012   Procedure:  ARTHROSCOPY KNEE;  Surgeon: Alta Corning, MD;  Location: [facility];  Service: Orthopedics;  Laterality: Right;  Lateral Retinacular Release, Chondroplasty Medial Femoral Condyle and Patella, Partial Lateral Meniscectomy  . QUADRICEPS TENDON REPAIR  11/15/2012   Procedure: REPAIR QUADRICEP TENDON;  Surgeon: Alta Corning, MD;  Location: [facility];  Service: Orthopedics;  Laterality: Right;  WITH ALLOGRAFT  . TONSILLECTOMY     Past Medical History:  Diagnosis Date  . Arthritis    right knee  . Disorder of jaw    occ. becomes tight and locks with dental procedures  . Immature cataract    left eye  . Quadriceps tendon rupture 11/2012   right   There were no vitals taken for this visit.  Opioid Risk Score:   Fall Risk Score:  `1  Depression screen PHQ 2/9  Depression screen PHQ 2/9 05/31/2016  Decreased Interest 0  Down, Depressed, Hopeless 0  PHQ - 2 Score 0  Altered sleeping 2  Tired, decreased energy 0  Change in appetite 1  Feeling bad or failure about yourself  0  Trouble concentrating 0  Moving slowly or fidgety/restless 2  Suicidal thoughts 0  PHQ-9 Score 5     Review of Systems  HENT: Negative.   Eyes: Negative.   Respiratory: Negative.   Cardiovascular: Negative.   Gastrointestinal: Negative.   Endocrine: Negative.   Genitourinary: Negative.   Musculoskeletal: Negative.   Skin: Negative.   Allergic/Immunologic: Negative.   Neurological: Positive for weakness and numbness.  Hematological: Negative.   Psychiatric/Behavioral: Negative.        Objective:   Physical Exam BP (!) 148/88   Pulse 96   SpO2 98%   General: Alert and oriented x 3, No apparent distress. Moderately overweight. Has a straight cane in hand HEENT: Head is normocephalic, atraumatic, PERRLA, EOMI, sclera anicteric, oral mucosa pink and moist, dentition intact, ext ear canals clear,  Neck: Supple without JVD or lymphadenopathy Heart: Reg rate and rhythm.     Chest: CTA bilaterally without wheezes, rales, or rhonchi; no distress Abdomen: Soft, non-tender, non-distended, bowel sounds positive. Extremities: 1+ edema RLE.. Pulses are 2+. Both limbs appropriately warm. Skin: Clean and intact without signs of breakdown. Surgical incision noted right knee. Neuro: Pt is cognitively appropriate with normal insight, memory, and awareness. Cranial nerves 2-12 are intact. Sensory exam is normal except for diminished light  touch around the superior patella/distal quad.. Fine motor coordination is intact. No tremors. Motor function is grossly 5/5 bilateral UE's and LLE. RLE: 4/5 HF, 2+ KE, 3-4/5 KF, and 5/5 ADF/PF.  Musculoskeletal: Fairly normal sitting posture in respect to her head/neck, lumbar spine and shoulders.  Right knee is notable for edema superior to patella with associated pain to palpation. Small palpable nodules/residual scar tissue medial and lateral to incision as well as superior to patella. Patellar tendon/tibial area also tender with palpation especially near underlying suture. Entire area around quad/incision/patella/tibia is extremely tender, hyperalgesic to touch. No color or temperature changes are present.  Mild popliteal edema also seen. When standing right patella approximately 1 to 1.5 inches lower than left. Pt can achieve active right knee flexion to 90 degrees. Cannot extend knee past 45-50 degrees against gravity due to apparent weakness and pain. Noticeable atrophy of quad muscle and anterior thigh. 1+ Edema in distal right leg/ankle. Minimal ankle pain with weight bearing or ROM. With standing and gait she has noticeable genu valgus deformity on the right. She does appear to have antalgia with weight bearing.   Psych: Pt's affect is appropriate. Pt is cooperative and pleasant         Assessment & Plan:  1. 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 1. From a therapy/ortho recommendation standpoint, ongoing massage therapy and a NuStep Recumbent Trainer are completely appropriate as a means of chronically managing her edema, pain, and spasms as well as way to assist in strengthening her quadriceps and lower extremities in general 2. Mrs. Guardiola is unable to safely operate a motor vehicle using her right leg given the issues above. It is reasonable to conclude that she would benefit from adaptations to her vehicle which could include left-footed gas/brake pedals or hand controls. She is reliant on others to drive at present. (Daughter brought her to my office today.) 3. From a diagnostic standpoint, a triple phase bone scan to assess her right knee for complex regional pain syndrome would be of utility in directing further care. Future care could include desensitization physical therapy, oral steroids, lumbar sympathetic blocks, etc.  4. Mrs. Gessel seems to have done the best while exercising in the water. The water provides a low-impact, low-torque mechanism for resistance exercise which would be highly beneficial. Specifically, I would recommend formal aquatic physical therapy to promote quadriceps/thigh strengthening and improved gait technique. 5. In addition to, or in place of, the voltaren gel, the application of a topical, compounded cream could be useful for her pain control, particularly if this is CRPS. I would recommend a combination of 20% ketoprofen, 4% ketamine, 5% gabapentin, 5% lidocaine to start. This could be applied TID to QID to the right knee. Prescription lidocaine gel 5% might be helpful  by itself, too. She has not tolerated chronic, daily, oral NSAID's due to GI side effects.  6. Pt needs to improve her sleep patterns to better deal with her pain. I would recommend low dose amitriptyline 10-20 mg to start to help promote better sleep and also to assist any neuropathic component to this pain. I would continue with the lyrica as prescribed as she  seems to have had some benefit with this. 7. I did discuss the fact that given the chronicity and complexity of her injury and course, that she will likely have to deal with some pain the rest of her life. She seems to understand that, but wants more functionality out of her knee and improved quality of life.      At this time no medications or studies were ordered. I would be happy to see Mrs. Bertin back for further treatment in the future.     Meredith Staggers, MD, [facility] 05/31/2016

## 2016-06-01 DIAGNOSIS — M79609 Pain in unspecified limb: Secondary | ICD-10-CM | POA: Diagnosis not present

## 2016-06-01 DIAGNOSIS — I890 Lymphedema, not elsewhere classified: Secondary | ICD-10-CM | POA: Diagnosis not present

## 2016-06-01 DIAGNOSIS — M7989 Other specified soft tissue disorders: Secondary | ICD-10-CM | POA: Diagnosis not present

## 2016-06-01 DIAGNOSIS — I872 Venous insufficiency (chronic) (peripheral): Secondary | ICD-10-CM | POA: Diagnosis not present

## 2016-06-28 DIAGNOSIS — M7989 Other specified soft tissue disorders: Secondary | ICD-10-CM | POA: Diagnosis not present

## 2016-06-28 DIAGNOSIS — I872 Venous insufficiency (chronic) (peripheral): Secondary | ICD-10-CM | POA: Diagnosis not present

## 2016-06-28 DIAGNOSIS — M79609 Pain in unspecified limb: Secondary | ICD-10-CM | POA: Diagnosis not present

## 2016-06-28 DIAGNOSIS — I890 Lymphedema, not elsewhere classified: Secondary | ICD-10-CM | POA: Diagnosis not present

## 2016-07-05 ENCOUNTER — Encounter: Payer: Self-pay | Admitting: Family Medicine

## 2016-07-05 ENCOUNTER — Ambulatory Visit (INDEPENDENT_AMBULATORY_CARE_PROVIDER_SITE_OTHER): Payer: PPO | Admitting: Family Medicine

## 2016-07-05 VITALS — BP 128/76 | HR 102 | Temp 98.3°F | Resp 16 | Ht 64.0 in | Wt 189.3 lb

## 2016-07-05 DIAGNOSIS — IMO0001 Reserved for inherently not codable concepts without codable children: Secondary | ICD-10-CM

## 2016-07-05 DIAGNOSIS — G4700 Insomnia, unspecified: Secondary | ICD-10-CM | POA: Diagnosis not present

## 2016-07-05 DIAGNOSIS — Z1159 Encounter for screening for other viral diseases: Secondary | ICD-10-CM

## 2016-07-05 DIAGNOSIS — E669 Obesity, unspecified: Secondary | ICD-10-CM

## 2016-07-05 DIAGNOSIS — I872 Venous insufficiency (chronic) (peripheral): Secondary | ICD-10-CM

## 2016-07-05 DIAGNOSIS — Z1231 Encounter for screening mammogram for malignant neoplasm of breast: Secondary | ICD-10-CM

## 2016-07-05 DIAGNOSIS — Z13220 Encounter for screening for lipoid disorders: Secondary | ICD-10-CM | POA: Diagnosis not present

## 2016-07-05 DIAGNOSIS — Z1211 Encounter for screening for malignant neoplasm of colon: Secondary | ICD-10-CM | POA: Diagnosis not present

## 2016-07-05 DIAGNOSIS — R030 Elevated blood-pressure reading, without diagnosis of hypertension: Secondary | ICD-10-CM | POA: Diagnosis not present

## 2016-07-05 DIAGNOSIS — E66811 Obesity, class 1: Secondary | ICD-10-CM

## 2016-07-05 NOTE — Progress Notes (Signed)
Name: Barbara Rivers   MRN: [id number]    DOB: 02/07/1956   Date:07/05/2016       Progress Note  Subjective  Chief Complaint  Chief Complaint  Patient presents with  . Establish Care    New Patient Appointment     HPI  Insomnia: she has problems staying asleep secondary to pain, wakes up multiple times per night. Recently seen by Dr. Riley [facility], he suggested switching from Lyrica to Elavil, but now she has to wait for workman's comp provider to change it  Elevated bp : she never had a history of HTN, but bp has been elevated intermittently over the past few years secondary to pain. She denies chest pain or palpitation  Obesity: she has been unable to exercise as much, she still gets in the pool to exercise for short periods of time. She gets tired and can't do for a long time. She drinks mostly water, green tea, difficulty preparing meals because she has difficulty standing up for prolonged periods of time.   Venous insufficiency: she has seen vascular surgeon and will have a laser ablation soon. She was having a lot of swelling on the right ankle recently and also the top of her right foot  Patient Active Problem List   Diagnosis Date Noted  . Venous insufficiency of right leg 07/05/2016  . Obesity (BMI 30.0-34.9) 07/05/2016  . Elevated blood pressure 07/05/2016  . Right knee pain 05/31/2016  . Primary osteoarthritis of right knee 05/31/2016  . Quadriceps tendon rupture 05/31/2016  . 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 Muscle wasting and atrophy, not elsewhere classified, right thigh 05/31/2016    Past Surgical History:  Procedure Laterality Date  . CERVICAL CONIZATION W/BX    . KNEE ARTHROSCOPY  02/23/2012   Procedure: ARTHROSCOPY KNEE;  Surgeon: Harvie Junior, MD;  Location: [facility];  Service: Orthopedics;  Laterality: Right;  Lateral Retinacular Release, Chondroplasty Medial Femoral Condyle and Patella, Partial Lateral Meniscectomy  . QUADRICEPS  TENDON REPAIR  11/15/2012   Procedure: REPAIR QUADRICEP TENDON;  Surgeon: Harvie Junior, MD;  Location: [facility];  Service: Orthopedics;  Laterality: Right;  WITH ALLOGRAFT  . TONSILLECTOMY      Family History  Problem Relation Age of Onset  . Diabetes Father   . Kidney disease Father   . Hypertension Father   . Cancer Maternal Grandmother 63    Breast  . Diabetes Paternal Grandmother     Social History   Social History  . Marital status: Married    Spouse name: Greggory Stallion  . Number of children: 1  . Years of education: N/A   Occupational History  . disabled     workman's comp    Social History Main Topics  . Smoking status: Never Smoker  . Smokeless tobacco: Never Used  . Alcohol use Yes     Comment: rarely  . Drug use: No  . Sexual activity: No   Other Topics Concern  . Not on file   Social History Narrative   She used to work but since 2013 , had a injury at work and one year later they found out that she had quad surgery, she still has weakness , daily pain and uses a cane. Fully disabled.    Husband also had a stroke and works from home     Current Outpatient Prescriptions:  .  diclofenac sodium (VOLTAREN) 1 % GEL, Apply topically 4 (four) times daily., Disp: ,  Rfl:  .  ibuprofen (ADVIL,MOTRIN) 100 MG tablet, Take 100 mg by mouth every 6 (six) hours as needed for fever., Disp: , Rfl:  .  Lido-Capsaicin-Men-Methyl Sal (MEDI-PATCH-LIDOCAINE) 0.5-0.035-5-20 % PTCH, Apply topically., Disp: , Rfl:  .  Multiple Vitamin (MULTIVITAMIN) tablet, Take 1 tablet by mouth daily., Disp: , Rfl:  .  pregabalin (LYRICA) 150 MG capsule, Take 150 mg by mouth daily. , Disp: , Rfl:   Allergies  Allergen Reactions  . Penicillins Hives     ROS  Constitutional: Negative for fever , positive for  weight change.  Respiratory: Negative for cough and shortness of breath.   Cardiovascular: Negative for chest pain or palpitations.  Gastrointestinal: Negative for  abdominal pain, no bowel changes.  Musculoskeletal:Positive for gait problem and  joint swelling.  Skin: Negative for rash.  Neurological: Negative for dizziness or headache.  No other specific complaints in a complete review of systems (except as listed in HPI above).  Objective  Vitals:   07/05/16 1020  BP: (!) 146/82  Pulse: (!) 102  Resp: 16  Temp: 98.3 F (36.8 C)  TempSrc: Oral  SpO2: 96%  Weight: 189 lb 4.8 oz (85.9 kg)  Height: 5\' 4"  (1.626 m)    Body mass index is 32.49 kg/m.  Physical Exam  Constitutional: Patient appears well-developed and well-nourished. Obese No distress.  HEENT: head atraumatic, normocephalic, pupils equal and reactive to light,  neck supple, throat within normal limits Cardiovascular: Normal rate, regular rhythm and normal heart sounds.  No murmur heard. No BLE edema. Pulmonary/Chest: Effort normal and breath sounds normal. No respiratory distress. Abdominal: Soft.  There is no tenderness. Psychiatric: Patient has a normal mood and affect. behavior is normal. Judgment and thought content normal. Muscular Skeletal: scaring of right knee, uses a cane, she is unable to raise right leg to 90 degrees unless if she uses her hand to assist  PHQ2/9: Depression screen [facility] 2/9 07/05/2016 05/31/2016  Decreased Interest 0 0  Down, Depressed, Hopeless 0 0  PHQ - 2 Score 0 0  Altered sleeping - 2  Tired, decreased energy - 0  Change in appetite - 1  Feeling bad or failure about yourself  - 0  Trouble concentrating - 0  Moving slowly or fidgety/restless - 2  Suicidal thoughts - 0  PHQ-9 Score - 5     Fall Risk: Fall Risk  07/05/2016  Falls in the past year? No      Functional Status Survey: Is the patient deaf or have difficulty hearing?: No Does the patient have difficulty seeing, even when wearing glasses/contacts?: No Does the patient have difficulty concentrating, remembering, or making decisions?: No Does the patient have difficulty  walking or climbing stairs?: Yes (Walks with a cane, due to right knee pain is unable to bear weight on it) Does the patient have difficulty dressing or bathing?: No Does the patient have difficulty doing errands alone such as visiting a doctor's office or shopping?: Yes (Patient does not drive)    Assessment & Plan  1. Elevated blood pressure  - CBC with Differential/Platelet - Comprehensive metabolic panel - TSH  2. Venous insufficiency of right leg  Continue follow up with vascular surgeon   3. Obesity (BMI 30.0-34.9)  Discussed with the patient the risk posed by an increased BMI. Discussed importance of portion control, calorie counting and at least 150 minutes of physical activity weekly. Avoid sweet beverages and drink more water. Eat at least 6 servings of fruit and vegetables  daily   4. Insomnia  She will wait to change from Lyrica to Elavil by workman's comp panel  5. Lipid screening  - Lipid panel  6. Colon cancer screening  - Cologuard  7. Need for hepatitis C screening test  - Hepatitis C antibody   8. Encounter for screening mammogram for breast cancer  - MM Digital Screening; Future

## 2016-07-17 ENCOUNTER — Telehealth: Payer: Self-pay | Admitting: Family Medicine

## 2016-07-17 NOTE — Telephone Encounter (Signed)
Patient wants a note stating she was not treated for high BP medication at visit and does not have a history of high BP. Patient only has a raise in blood pressure due to pain when moving around. Patient will need it in writing for her function physical.

## 2016-07-17 NOTE — Telephone Encounter (Signed)
Okay to write a note stating that she does not have HTN, blood pressure was found to be elevated but per her history usually secondary to pain

## 2016-07-18 NOTE — Telephone Encounter (Signed)
Patient has been notified and will pick up when having blood draw. Note is on my desk.

## 2016-07-19 DIAGNOSIS — Z13220 Encounter for screening for lipoid disorders: Secondary | ICD-10-CM | POA: Diagnosis not present

## 2016-07-19 DIAGNOSIS — Z1159 Encounter for screening for other viral diseases: Secondary | ICD-10-CM | POA: Diagnosis not present

## 2016-07-19 DIAGNOSIS — R030 Elevated blood-pressure reading, without diagnosis of hypertension: Secondary | ICD-10-CM | POA: Diagnosis not present

## 2016-07-20 LAB — COMPREHENSIVE METABOLIC PANEL
ALK PHOS: 124 U/L (ref 33–130)
ALT: 36 U/L — AB (ref 6–29)
AST: 22 U/L (ref 10–35)
Albumin: 4.1 g/dL (ref 3.6–5.1)
BUN: 10 mg/dL (ref 7–25)
CO2: 29 mmol/L (ref 20–31)
Calcium: 9.4 mg/dL (ref 8.6–10.4)
Chloride: 100 mmol/L (ref 98–110)
Creat: 0.62 mg/dL (ref 0.50–1.05)
GLUCOSE: 113 mg/dL — AB (ref 65–99)
Potassium: 4.2 mmol/L (ref 3.5–5.3)
SODIUM: 136 mmol/L (ref 135–146)
TOTAL PROTEIN: 7 g/dL (ref 6.1–8.1)
Total Bilirubin: 0.6 mg/dL (ref 0.2–1.2)

## 2016-07-20 LAB — CBC WITH DIFFERENTIAL/PLATELET
BASOS PCT: 1 %
Basophils Absolute: 81 cells/uL (ref 0–200)
EOS PCT: 4 %
Eosinophils Absolute: 324 cells/uL (ref 15–500)
HCT: 42.9 % (ref 35.0–45.0)
Hemoglobin: 14.3 g/dL (ref 11.7–15.5)
LYMPHS PCT: 38 %
Lymphs Abs: 3078 cells/uL (ref 850–3900)
MCH: 30.3 pg (ref 27.0–33.0)
MCHC: 33.3 g/dL (ref 32.0–36.0)
MCV: 90.9 fL (ref 80.0–100.0)
MPV: 11.6 fL (ref 7.5–12.5)
Monocytes Absolute: 729 cells/uL (ref 200–950)
Monocytes Relative: 9 %
Neutro Abs: 3888 cells/uL (ref 1500–7800)
Neutrophils Relative %: 48 %
PLATELETS: 383 10*3/uL (ref 140–400)
RBC: 4.72 MIL/uL (ref 3.80–5.10)
RDW: 13 % (ref 11.0–15.0)
WBC: 8.1 10*3/uL (ref 3.8–10.8)

## 2016-07-20 LAB — LIPID PANEL
CHOLESTEROL: 216 mg/dL — AB (ref 125–200)
HDL: 61 mg/dL (ref >=46)
LDL Cholesterol: 123 mg/dL (ref <130)
Total CHOL/HDL Ratio: 3.5 Ratio (ref <=5.0)
Triglycerides: 162 mg/dL — ABNORMAL HIGH (ref <150)
VLDL: 32 mg/dL — ABNORMAL HIGH (ref <30)

## 2016-07-20 LAB — HEPATITIS C ANTIBODY: HCV Ab: NEGATIVE

## 2016-07-20 LAB — TSH: TSH: 1.12 mIU/L

## 2016-07-26 ENCOUNTER — Encounter (INDEPENDENT_AMBULATORY_CARE_PROVIDER_SITE_OTHER): Payer: Self-pay

## 2016-07-26 DIAGNOSIS — I8390 Asymptomatic varicose veins of unspecified lower extremity: Secondary | ICD-10-CM

## 2016-07-31 DIAGNOSIS — Z1211 Encounter for screening for malignant neoplasm of colon: Secondary | ICD-10-CM | POA: Diagnosis not present

## 2016-07-31 DIAGNOSIS — Z1212 Encounter for screening for malignant neoplasm of rectum: Secondary | ICD-10-CM | POA: Diagnosis not present

## 2016-08-03 LAB — COLOGUARD: Cologuard: NEGATIVE

## 2016-08-09 ENCOUNTER — Ambulatory Visit
Admission: RE | Admit: 2016-08-09 | Discharge: 2016-08-09 | Disposition: A | Discharge status: Home / Self Care | Payer: PPO | Source: Ambulatory Visit | Attending: Family Medicine | Admitting: Family Medicine

## 2016-08-09 DIAGNOSIS — Z1231 Encounter for screening mammogram for malignant neoplasm of breast: Secondary | ICD-10-CM | POA: Diagnosis not present

## 2016-08-10 ENCOUNTER — Inpatient Hospital Stay
Admission: RE | Admit: 2016-08-10 | Discharge: 2016-08-10 | Disposition: A | Discharge status: Home / Self Care | Payer: Self-pay | Source: Ambulatory Visit | Attending: *Deleted | Admitting: *Deleted

## 2016-08-10 ENCOUNTER — Other Ambulatory Visit: Payer: Self-pay | Admitting: *Deleted

## 2016-08-10 DIAGNOSIS — Z9289 Personal history of other medical treatment: Secondary | ICD-10-CM

## 2016-08-11 ENCOUNTER — Other Ambulatory Visit: Payer: Self-pay | Admitting: Family Medicine

## 2016-08-11 DIAGNOSIS — N6320 Unspecified lump in the left breast, unspecified quadrant: Secondary | ICD-10-CM

## 2016-08-11 DIAGNOSIS — N6310 Unspecified lump in the right breast, unspecified quadrant: Secondary | ICD-10-CM

## 2016-08-11 DIAGNOSIS — R928 Other abnormal and inconclusive findings on diagnostic imaging of breast: Secondary | ICD-10-CM

## 2016-08-21 ENCOUNTER — Ambulatory Visit
Admission: RE | Admit: 2016-08-21 | Discharge: 2016-08-21 | Disposition: A | Discharge status: Home / Self Care | Payer: PPO | Source: Ambulatory Visit | Attending: Family Medicine | Admitting: Family Medicine

## 2016-08-21 DIAGNOSIS — N6310 Unspecified lump in the right breast, unspecified quadrant: Secondary | ICD-10-CM

## 2016-08-21 DIAGNOSIS — R928 Other abnormal and inconclusive findings on diagnostic imaging of breast: Secondary | ICD-10-CM

## 2016-08-21 DIAGNOSIS — N6320 Unspecified lump in the left breast, unspecified quadrant: Secondary | ICD-10-CM

## 2016-08-23 ENCOUNTER — Other Ambulatory Visit: Payer: Self-pay | Admitting: Family Medicine

## 2016-08-23 DIAGNOSIS — R928 Other abnormal and inconclusive findings on diagnostic imaging of breast: Secondary | ICD-10-CM

## 2016-08-28 ENCOUNTER — Ambulatory Visit (INDEPENDENT_AMBULATORY_CARE_PROVIDER_SITE_OTHER): Payer: PPO | Admitting: Vascular Surgery

## 2016-09-18 ENCOUNTER — Ambulatory Visit (INDEPENDENT_AMBULATORY_CARE_PROVIDER_SITE_OTHER): Payer: PPO | Admitting: Family Medicine

## 2016-09-18 ENCOUNTER — Encounter: Payer: Self-pay | Admitting: Family Medicine

## 2016-09-18 ENCOUNTER — Ambulatory Visit (INDEPENDENT_AMBULATORY_CARE_PROVIDER_SITE_OTHER): Payer: Self-pay | Admitting: Vascular Surgery

## 2016-09-18 VITALS — BP 134/80 | HR 78 | Temp 98.8°F | Resp 16 | Ht 64.0 in | Wt 189.5 lb

## 2016-09-18 DIAGNOSIS — Z9889 Other specified postprocedural states: Secondary | ICD-10-CM

## 2016-09-18 DIAGNOSIS — R739 Hyperglycemia, unspecified: Secondary | ICD-10-CM | POA: Diagnosis not present

## 2016-09-18 DIAGNOSIS — Z23 Encounter for immunization: Secondary | ICD-10-CM | POA: Diagnosis not present

## 2016-09-18 DIAGNOSIS — Z0000 Encounter for general adult medical examination without abnormal findings: Secondary | ICD-10-CM

## 2016-09-18 DIAGNOSIS — I872 Venous insufficiency (chronic) (peripheral): Secondary | ICD-10-CM | POA: Diagnosis not present

## 2016-09-18 DIAGNOSIS — G5771 Causalgia of right lower limb: Secondary | ICD-10-CM | POA: Diagnosis not present

## 2016-09-18 DIAGNOSIS — Z79899 Other long term (current) drug therapy: Secondary | ICD-10-CM

## 2016-09-18 DIAGNOSIS — Z124 Encounter for screening for malignant neoplasm of cervix: Secondary | ICD-10-CM | POA: Diagnosis not present

## 2016-09-18 DIAGNOSIS — G4709 Other insomnia: Secondary | ICD-10-CM

## 2016-09-18 LAB — HEMOGLOBIN A1C
HEMOGLOBIN A1C: 6.1 % — AB (ref <5.7)
MEAN PLASMA GLUCOSE: 128 mg/dL

## 2016-09-18 MED ORDER — AMITRIPTYLINE HCL 10 MG PO TABS: 10.0000 mg | ORAL_TABLET | Freq: Every day | ORAL | 0 refills | Status: DC

## 2016-09-18 NOTE — Progress Notes (Signed)
Name: Barbara Rivers   MRN: [id number]    DOB: 12/08/1955   Date:09/18/2016       Progress Note  Subjective  Chief Complaint  Chief Complaint  Patient presents with  . Annual Exam  . Flu Vaccine    HPI  Functional ability/safety issues: uses a cane, difficulty ambulating  Hearing issues: Addressed  Activities of daily living: Discussed - she is limited in doing things at home - such as cleaning or cooking, because she needs to take breaks every 15 minutes  Home safety issues: No Issues  End Of Life Planning: Offered verbal information regarding advanced directives, healthcare power of attorney.  Preventative care, Health maintenance, Preventative health measures discussed.  Preventative screenings discussed today: lab work, colonoscopy,  mammogram, DEXA.  Low Dose CT Chest recommended if Age 48-80 years, 30 pack-year currently smoking OR have quit w/in 15years.   Lifestyle risk factor issued reviewed: Diet, exercise, weight management, advised patient smoking is not healthy, nutrition/diet.  Preventative health measures discussed (5-10 year plan).  Reviewed and recommended vaccinations: - Pneumovax  - Prevnar  - Annual Influenza - Zostavax - Tdap ( medicare will not cover it )  Depression screening: Done Fall risk screening: Done Discuss ADLs/IADLs: Done  Current medical providers: See HPI  Other health risk factors identified this visit: No other issues Cognitive impairment issues: None identified  All above discussed with patient. Appropriate education, counseling and referral will be made based upon the above.   Insomnia: she has difficulty falling and staying asleep secondary to pain, seen by Dr. Cathleen Corti and he recommended Elavil to control pain from CRPS. We will try it, discussed side effects. We will check EKG for prolonged QT  CRPS: she is still having daily pain on right knee, decrease rom , uses a cane for ambulation, only takes Lyrica at night not able to  tolerate morning dose. Unable to work secondary to pain and has social security disability  Hyperglycemia: she denies polyphagia, polyuria or polydipsia, she has family history of DM. Last fasting glucose was elevated  Patient Active Problem List   Diagnosis Date Noted  . VV (varicose veins) 07/26/2016  . Venous insufficiency of right leg 07/05/2016  . Obesity (BMI 30.0-34.9) 07/05/2016  . Elevated blood pressure 07/05/2016  . Right knee pain 05/31/2016  . Primary osteoarthritis of right knee 05/31/2016  . Quadriceps tendon rupture 05/31/2016  . 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 Muscle wasting and atrophy, not elsewhere classified, right thigh 05/31/2016    Past Surgical History:  Procedure Laterality Date  . CERVICAL CONIZATION W/BX    . KNEE ARTHROSCOPY  02/23/2012   Procedure: ARTHROSCOPY KNEE;  Surgeon: Alta Corning, MD;  Location: [place];  Service: Orthopedics;  Laterality: Right;  Lateral Retinacular Release, Chondroplasty Medial Femoral Condyle and Patella, Partial Lateral Meniscectomy  . QUADRICEPS TENDON REPAIR  11/15/2012   Procedure: REPAIR QUADRICEP TENDON;  Surgeon: Alta Corning, MD;  Location: [place];  Service: Orthopedics;  Laterality: Right;  WITH ALLOGRAFT  . TONSILLECTOMY      Family History  Problem Relation Age of Onset  . Diabetes Father   . Kidney disease Father   . Hypertension Father   . Cancer Maternal Grandmother 45    Breast  . Breast cancer Maternal Grandmother 59  . Diabetes Paternal Grandmother     Social History   Social History  . Marital status: Married    Spouse name: [name]  . Number of  children: 1  . Years of education: N/A   Occupational History  . disabled     workman's comp    Social History Main Topics  . Smoking status: Never Smoker  . Smokeless tobacco: Never Used  . Alcohol use Yes     Comment: rarely  . Drug use: No  . Sexual activity: No   Other Topics Concern  . Not on file    Social History Narrative   She used to work but since 2013 , had a injury at work and one year later they found out that she had quad surgery, she still has weakness , daily pain and uses a cane. Fully disabled.    Husband also had a stroke and works from home     Current Outpatient Prescriptions:  .  amitriptyline (ELAVIL) 10 MG tablet, Take 1-2 tablets (10-20 mg total) by mouth at bedtime., Disp: 60 tablet, Rfl: 0 .  diclofenac sodium (VOLTAREN) 1 % GEL, Apply topically 4 (four) times daily., Disp: , Rfl:  .  ibuprofen (ADVIL,MOTRIN) 100 MG tablet, Take 100 mg by mouth every 6 (six) hours as needed for fever., Disp: , Rfl:  .  Lido-Capsaicin-Men-Methyl Sal (MEDI-PATCH-LIDOCAINE) 0.5-0.035-5-20 % PTCH, Apply topically., Disp: , Rfl:  .  Multiple Vitamin (MULTIVITAMIN) tablet, Take 1 tablet by mouth daily., Disp: , Rfl:  .  pregabalin (LYRICA) 150 MG capsule, Take 150 mg by mouth daily. , Disp: , Rfl:   Allergies  Allergen Reactions  . Penicillins Hives     ROS  Constitutional: Negative for fever or weight change.  Respiratory: Negative for cough and shortness of breath.   Cardiovascular: Negative for chest pain or palpitations.  Gastrointestinal: Negative for abdominal pain, no bowel changes.  Musculoskeletal: Positive  for gait problem and  joint swelling.  Skin: Negative for rash.  Neurological: Negative for dizziness or headache.  No other specific complaints in a complete review of systems (except as listed in HPI above).  Objective  Vitals:   09/18/16 1433  BP: 134/80  Pulse: 78  Resp: 16  Temp: 98.8 F (37.1 C)  TempSrc: Oral  SpO2: 95%  Weight: 189 lb 8 oz (86 kg)  Height: '5\' 4"'  (1.626 m)    Body mass index is 32.53 kg/m.  Physical Exam  Constitutional: Patient appears well-developed and well-nourished, obese No distress.  HENT: Head: Normocephalic and atraumatic. Ears: B TMs ok, no erythema or effusion; Nose: Nose normal. Mouth/Throat: Oropharynx is  clear and moist. No oropharyngeal exudate.  Eyes: Conjunctivae and EOM are normal. Pupils are equal, round, and reactive to light. No scleral icterus.  Neck: Normal range of motion. Neck supple. No JVD present. No thyromegaly present.  Cardiovascular: Normal rate, regular rhythm and normal heart sounds.  No murmur heard. No BLE edema. Varicose veins, puffy on right lateral ankle Pulmonary/Chest: Effort normal and breath sounds normal. No respiratory distress. Abdominal: Soft. Bowel sounds are normal, no distension. There is no tenderness. no masses Breast: no lumps or masses, no nipple discharge or rashes FEMALE GENITALIA:  External genitalia normal External urethra normal Vaginal vault normal without discharge or lesions Cervix normal without discharge or lesions Bimanual exam normal without masses RECTAL: not done Musculoskeletal: scar from surgery right knee, mild effusion,  She has decrease in rom of right knee, 90 degree flexion also decrease in extension  Neurological: he is alert and oriented to person, place, and time. No cranial nerve deficit. Coordination, balance, strength, speech and gait are normal. right knee  has paresthesia Skin: Skin is warm and dry. No rash noted. No erythema.  Psychiatric: Patient has a normal mood and affect. behavior is normal. Judgment and thought content normal.  Recent Results (from the past 2160 hour(s))  CBC with Differential/Platelet     Status: None   Collection Time: 07/19/16  8:43 AM  Result Value Ref Range   WBC 8.1 3.8 - 10.8 K/uL   RBC 4.72 3.80 - 5.10 MIL/uL   Hemoglobin 14.3 11.7 - 15.5 g/dL   HCT 42.9 35.0 - 45.0 %   MCV 90.9 80.0 - 100.0 fL   MCH 30.3 27.0 - 33.0 pg   MCHC 33.3 32.0 - 36.0 g/dL   RDW 13.0 11.0 - 15.0 %   Platelets 383 140 - 400 K/uL   MPV 11.6 7.5 - 12.5 fL   Neutro Abs 3,888 1,500 - 7,800 cells/uL   Lymphs Abs 3,078 850 - 3,900 cells/uL   Monocytes Absolute 729 200 - 950 cells/uL   Eosinophils Absolute 324 15 -  500 cells/uL   Basophils Absolute 81 0 - 200 cells/uL   Neutrophils Relative % 48 %   Lymphocytes Relative 38 %   Monocytes Relative 9 %   Eosinophils Relative 4 %   Basophils Relative 1 %   Smear Review Criteria for review not met   Comprehensive metabolic panel     Status: Abnormal   Collection Time: 07/19/16  8:43 AM  Result Value Ref Range   Sodium 136 135 - 146 mmol/L   Potassium 4.2 3.5 - 5.3 mmol/L   Chloride 100 98 - 110 mmol/L   CO2 29 20 - 31 mmol/L   Glucose, Bld 113 (H) 65 - 99 mg/dL   BUN 10 7 - 25 mg/dL   Creat 0.62 0.50 - 1.05 mg/dL    Comment:   For patients > or = 59 years of age: The upper reference limit for Creatinine is approximately 13% higher for people identified as African-American.      Total Bilirubin 0.6 0.2 - 1.2 mg/dL   Alkaline Phosphatase 124 33 - 130 U/L   AST 22 10 - 35 U/L   ALT 36 (H) 6 - 29 U/L   Total Protein 7.0 6.1 - 8.1 g/dL   Albumin 4.1 3.6 - 5.1 g/dL   Calcium 9.4 8.6 - 10.4 mg/dL  Lipid panel     Status: Abnormal   Collection Time: 07/19/16  8:43 AM  Result Value Ref Range   Cholesterol 216 (H) 125 - 200 mg/dL   Triglycerides 162 (H) <150 mg/dL   HDL 61 >=46 mg/dL   Total CHOL/HDL Ratio 3.5 <=5.0 Ratio   VLDL 32 (H) <30 mg/dL   LDL Cholesterol 123 <130 mg/dL    Comment:   Total Cholesterol/HDL Ratio:CHD Risk                        Coronary Heart Disease Risk Table                                        Men       Women          1/2 Average Risk              3.4        3.3  Average Risk              5.0        4.4           2X Average Risk              9.6        7.1           3X Average Risk             23.4       11.0 Use the calculated Patient Ratio above and the CHD Risk table  to determine the patient's CHD Risk.   Hepatitis C antibody     Status: None   Collection Time: 07/19/16  8:43 AM  Result Value Ref Range   HCV Ab NEGATIVE NEGATIVE  TSH     Status: None   Collection Time: 07/19/16  8:44 AM   Result Value Ref Range   TSH 1.12 mIU/L    Comment:   Reference Range   > or = 20 Years  0.40-4.50   Pregnancy Range First trimester  0.26-2.66 Second trimester 0.55-2.73 Third trimester  0.43-2.91     Cologuard     Status: None   Collection Time: 08/03/16 12:00 AM  Result Value Ref Range   Cologuard Negative     Comment: [employer]      PHQ2/9: Depression screen [facility] 2/9 07/05/2016 05/31/2016  Decreased Interest 0 0  Down, Depressed, Hopeless 0 0  PHQ - 2 Score 0 0  Altered sleeping - 2  Tired, decreased energy - 0  Change in appetite - 1  Feeling bad or failure about yourself  - 0  Trouble concentrating - 0  Moving slowly or fidgety/restless - 2  Suicidal thoughts - 0  PHQ-9 Score - 5    Fall Risk: Fall Risk  07/05/2016  Falls in the past year? No     Functional Status Survey: Is the patient deaf or have difficulty hearing?: No Does the patient have difficulty seeing, even when wearing glasses/contacts?: No Does the patient have difficulty concentrating, remembering, or making decisions?: No Does the patient have difficulty walking or climbing stairs?: Yes Does the patient have difficulty dressing or bathing?: No Does the patient have difficulty doing errands alone such as visiting a doctor's office or shopping?: No    Assessment & Plan  1. Welcome to Medicare preventive visit  Discussed importance of 150 minutes of physical activity weekly, eat two servings of fish weekly, eat one serving of tree nuts ( cashews, pistachios, pecans, almonds.Marland Kitchen) every other day, eat 6 servings of fruit/vegetables daily and drink plenty of water and avoid sweet beverages.  - DG Bone Density; Future  2. Venous insufficiency of right leg  Wears compression stocking hose  3. Complex regional pain syndrome type 2 of right lower extremity  Sees Dr. Cathleen Corti - Rehabilitation and Physical Medicine  4. Other insomnia  - amitriptyline (ELAVIL) 10 MG tablet; Take 1-2 tablets  (10-20 mg total) by mouth at bedtime.  Dispense: 60 tablet; Refill: 0  5. High risk medication use  - EKG 12-Lead  6. Encounter for screening for cervical cancer   - PapLb, HPV, rfx16/18  7. History of cone biopsy of cervix  - PapLb, HPV, rfx16/18  8. Hyperglycemia  - Hemoglobin A1c - Insulin, Fasting

## 2016-09-18 NOTE — Addendum Note (Signed)
Addended by: Lolita Rieger D on: 09/18/2016 03:49 PM   Modules accepted: Orders

## 2016-09-19 ENCOUNTER — Other Ambulatory Visit: Payer: Self-pay | Admitting: Family Medicine

## 2016-09-19 DIAGNOSIS — Z9889 Other specified postprocedural states: Secondary | ICD-10-CM | POA: Diagnosis not present

## 2016-09-19 DIAGNOSIS — Z124 Encounter for screening for malignant neoplasm of cervix: Secondary | ICD-10-CM | POA: Diagnosis not present

## 2016-09-19 LAB — INSULIN, FASTING: INSULIN FASTING, SERUM: 13.4 u[IU]/mL (ref 2.0–19.6)

## 2016-09-22 DIAGNOSIS — H25042 Posterior subcapsular polar age-related cataract, left eye: Secondary | ICD-10-CM | POA: Diagnosis not present

## 2016-09-22 DIAGNOSIS — H40033 Anatomical narrow angle, bilateral: Secondary | ICD-10-CM | POA: Diagnosis not present

## 2016-09-22 LAB — PAPLB, HPV, RFX16/18
HPV, high-risk: NEGATIVE
PAP Smear Comment: 0

## 2016-09-25 ENCOUNTER — Encounter: Payer: PPO | Admitting: Family Medicine

## 2016-10-11 ENCOUNTER — Ambulatory Visit
Admission: RE | Admit: 2016-10-11 | Discharge: 2016-10-11 | Disposition: A | Discharge status: Home / Self Care | Payer: PPO | Source: Ambulatory Visit | Attending: Family Medicine | Admitting: Family Medicine

## 2016-10-11 DIAGNOSIS — Z13820 Encounter for screening for osteoporosis: Secondary | ICD-10-CM | POA: Insufficient documentation

## 2016-10-11 DIAGNOSIS — Z0000 Encounter for general adult medical examination without abnormal findings: Secondary | ICD-10-CM | POA: Insufficient documentation

## 2016-10-11 DIAGNOSIS — Z780 Asymptomatic menopausal state: Secondary | ICD-10-CM | POA: Insufficient documentation

## 2016-10-16 ENCOUNTER — Telehealth: Payer: Self-pay | Admitting: Physical Medicine & Rehabilitation

## 2016-10-16 NOTE — Telephone Encounter (Signed)
Atty Dina Rich who requested IME from you in July left voicemail for me to ask if you will prescribe and issue RX to their office for this patient -- meds in July were Voltaren gel/advil/lidocaine path/anaprox, lyrica/multivitamin --- the voicemail does not state what they are asking you to prescribe    Phone number [phone]-- message implies that we are aware of this request - but I see no other documentation.

## 2016-10-16 NOTE — Telephone Encounter (Signed)
I am willing to rx meds if I'm following her as a patient. I will not rx right now as I haven't seen her since July

## 2016-10-17 ENCOUNTER — Telehealth: Payer: Self-pay | Admitting: Family Medicine

## 2016-10-17 ENCOUNTER — Other Ambulatory Visit: Payer: Self-pay | Admitting: Family Medicine

## 2016-10-17 DIAGNOSIS — G4709 Other insomnia: Secondary | ICD-10-CM

## 2016-10-17 MED ORDER — AMITRIPTYLINE HCL 10 MG PO TABS: 10.0000 mg | ORAL_TABLET | Freq: Every day | ORAL | 1 refills | Status: DC

## 2016-10-17 NOTE — Telephone Encounter (Signed)
Left the message from Dr Naaman Plummer on Barbara Rivers voicemail.

## 2016-10-17 NOTE — Telephone Encounter (Signed)
Patient is requesting refill on Lido-Capsaicin Compound Cream. States you had never prescribed this cream before, but you and her had discussed it at her last visit and you told her that you would. States that you two had went over her records from Dr. Alger Simons office on page 9. Asking that you send to [facility].  Also, patient is not needing a refill on Amitriptyline but was told to inform if the medication worked for her. Patient does like the 10 mg and would like for you to notate that in her chart.  If you have any questions please give patient a call [phone]

## 2016-10-17 NOTE — Telephone Encounter (Signed)
I sent refill of Elavil, but I will need the request for the compound to be faxed to [place] from the pharmacy

## 2016-10-18 ENCOUNTER — Other Ambulatory Visit: Payer: Self-pay | Admitting: Family Medicine

## 2016-10-18 ENCOUNTER — Other Ambulatory Visit: Payer: Self-pay

## 2016-10-18 MED ORDER — KETOPROFEN POWD: 2.0000 g | Freq: Four times a day (QID) | 0 refills | Status: DC

## 2016-10-18 NOTE — Telephone Encounter (Signed)
Left voice message for patient to return call. °

## 2016-11-13 ENCOUNTER — Ambulatory Visit (INDEPENDENT_AMBULATORY_CARE_PROVIDER_SITE_OTHER): Payer: Self-pay | Admitting: Vascular Surgery

## 2016-12-07 ENCOUNTER — Other Ambulatory Visit: Payer: Self-pay

## 2016-12-07 DIAGNOSIS — G4709 Other insomnia: Secondary | ICD-10-CM

## 2016-12-07 MED ORDER — AMITRIPTYLINE HCL 10 MG PO TABS: 10.0000 mg | ORAL_TABLET | Freq: Every day | ORAL | 1 refills | Status: DC

## 2016-12-14 ENCOUNTER — Ambulatory Visit: Payer: PPO | Admitting: Family Medicine

## 2017-01-08 ENCOUNTER — Ambulatory Visit (INDEPENDENT_AMBULATORY_CARE_PROVIDER_SITE_OTHER): Payer: Self-pay | Admitting: Vascular Surgery

## 2017-01-22 ENCOUNTER — Telehealth: Payer: Self-pay | Admitting: Family Medicine

## 2017-01-22 NOTE — Telephone Encounter (Signed)
Pt received letter in the mail pertaining to workers comp (think it is [employer]). Stated that they are needing more information to fill compound cream. Patient states that you do not have to do this the prescription has already been filled. Just an [place]  [phone]

## 2017-07-30 ENCOUNTER — Ambulatory Visit (INDEPENDENT_AMBULATORY_CARE_PROVIDER_SITE_OTHER): Payer: PPO

## 2017-07-30 VITALS — BP 150/82 | HR 56 | Temp 99.8°F | Ht 64.0 in | Wt 193.3 lb

## 2017-07-30 DIAGNOSIS — Z0000 Encounter for general adult medical examination without abnormal findings: Secondary | ICD-10-CM | POA: Diagnosis not present

## 2017-07-30 NOTE — Progress Notes (Signed)
Subjective:   Barbara Rivers is a 61 y.o. female who presents for an Initial Medicare Annual Wellness Visit.  Review of Systems    N/A  Cardiac Risk Factors include: obesity (BMI >30kg/m2)     Objective:    Today's Vitals   07/30/17 1335 07/30/17 1344  BP: (!) 154/80 (!) 150/82  Pulse: (!) 56   Temp: 99.8 F (37.7 C)   TempSrc: Oral   Weight: 193 lb 4.8 oz (87.7 kg)   Height: 5\' 4"  (1.626 m)   PainSc: 6  6   PainLoc: Leg    Body mass index is 33.18 kg/m.   Current Medications (verified) Outpatient Encounter Prescriptions as of 07/30/2017  Medication Sig  . Glucos-Chondroit-Collag-Hyal (JOINT SUPPORT FORMULA PO) Take by mouth.  Marland Kitchen ibuprofen (ADVIL,MOTRIN) 100 MG tablet Take 100 mg by mouth every 6 (six) hours as needed for fever.  . Ketoprofen POWD 2 g by Does not apply route every 6 (six) hours. 20 % ketoprofen, 5 % lidocaine, 5% gabapentin and 4 % ketamine  . Multiple Vitamin (MULTIVITAMIN) tablet Take 1 tablet by mouth daily.  Marland Kitchen amitriptyline (ELAVIL) 10 MG tablet Take 1 tablet (10 mg total) by mouth at bedtime. (Patient not taking: Reported on 07/30/2017)  . pregabalin (LYRICA) 150 MG capsule Take 150 mg by mouth daily.    No facility-administered encounter medications on file as of 07/30/2017.     Allergies (verified) Penicillins   History: Past Medical History:  Diagnosis Date  . Arthritis    right knee  . Disorder of jaw    occ. becomes tight and locks with dental procedures  . Immature cataract    left eye  . Quadriceps tendon rupture 11/2012   right   Past Surgical History:  Procedure Laterality Date  . CERVICAL CONIZATION W/BX    . KNEE ARTHROSCOPY  02/23/2012   Procedure: ARTHROSCOPY KNEE;  Surgeon: Alta Corning, MD;  Location: [place];  Service: Orthopedics;  Laterality: Right;  Lateral Retinacular Release, Chondroplasty Medial Femoral Condyle and Patella, Partial Lateral Meniscectomy  . QUADRICEPS TENDON REPAIR  11/15/2012   Procedure: REPAIR QUADRICEP TENDON;  Surgeon: Alta Corning, MD;  Location: [place];  Service: Orthopedics;  Laterality: Right;  WITH ALLOGRAFT  . TONSILLECTOMY     Family History  Problem Relation Age of Onset  . Diabetes Father   . Kidney disease Father   . Hypertension Father   . Cancer Maternal Grandmother 35       Breast  . Breast cancer Maternal Grandmother 90  . Diabetes Paternal Grandmother    Social History   Occupational History  . disabled     workman's comp    Social History Main Topics  . Smoking status: Never Smoker  . Smokeless tobacco: Never Used  . Alcohol use Yes     Comment: 1 glass of wine every several months  . Drug use: No  . Sexual activity: No    Tobacco Counseling Counseling given: Not Answered   Activities of Daily Living In your present state of health, do you have any difficulty performing the following activities: 07/30/2017 09/18/2016  Hearing? N N  Vision? N N  Difficulty concentrating or making decisions? N N  Walking or climbing stairs? Y Y  Comment due to right leg pain -  Dressing or bathing? N N  Doing errands, shopping? N N  Preparing Food and eating ? N -  Using the Toilet? N -  In the past six months, have you accidently leaked urine? N -  Do you have problems with loss of bowel control? N -  Managing your Medications? N -  Managing your Finances? N -  Housekeeping or managing your Housekeeping? Y -  Comment anything upstairs -  Some recent data might be hidden    Immunizations and Health Maintenance Immunization History  Administered Date(s) Administered  . Influenza,inj,Quad PF,6+ Mos 09/18/2016   Health Maintenance Due  Topic Date Due  . TETANUS/TDAP  04/14/1976  . INFLUENZA VACCINE  06/06/2017    Patient Care Team: Steele Sizer, MD as PCP - General (Family Medicine) Leandrew Koyanagi, MD as Referring Physician (Ophthalmology)  Indicate any recent Medical Services you may have received  from other than [facility] providers in the past year (date may be approximate).     Assessment:   This is a routine wellness examination for Barbara Rivers.   Hearing/Vision screen Vision Screening Comments: Pt sees Dr Wallace Going for vision checks yearly.   Dietary issues and exercise activities discussed: Current Exercise Habits: Home exercise routine, Type of exercise: Other - see comments (Nustep bike), Time (Minutes): 10 (10 minutes twice a day ), Frequency (Times/Week): 5, Weekly Exercise (Minutes/Week): 50, Intensity: Mild, Exercise limited by: orthopedic condition(s)  Goals    . Weight (lb) < 170 lb (77.1 kg)          Recommend cutting back on portion sizes to aid in loosing 20 lbs.       Depression Screen PHQ 2/9 Scores 07/30/2017 07/30/2017 07/05/2016 05/31/2016  PHQ - 2 Score 1 1 0 0  PHQ- 9 Score 2 - - 5    Fall Risk Fall Risk  07/30/2017 07/05/2016  Falls in the past year? No No    Cognitive Function: Pt declined screening today.        Screening Tests Health Maintenance  Topic Date Due  . TETANUS/TDAP  04/14/1976  . INFLUENZA VACCINE  06/06/2017  . HIV Screening  08/16/2029 (Originally 04/14/1971)  . MAMMOGRAM  08/21/2018  . Fecal DNA (Cologuard)  08/04/2019  . PAP SMEAR  09/20/2019  . Hepatitis C Screening  Completed      Plan:  I have personally reviewed and addressed the Medicare Annual Wellness questionnaire and have noted the following in the patient's chart:  A. Medical and social history B. Use of alcohol, tobacco or illicit drugs  C. Current medications and supplements D. Functional ability and status E.  Nutritional status F.  Physical activity G. Advance directives H. List of other physicians I.  Hospitalizations, surgeries, and ER visits in previous 12 months J.  [place] such as hearing and vision if needed, cognitive and depression L. Referrals and appointments - none  In addition, I have reviewed and discussed with patient certain  preventive protocols, quality metrics, and best practice recommendations. A written personalized care plan for preventive services as well as general preventive health recommendations were provided to patient.  See attached scanned questionnaire for additional information.   Signed,  Fabio Neighbors, LPN Nurse Health Advisor   MD Recommendations: Pt declined influenza and tetanus vaccine today. Pt would like to receive a flu vaccine later in the fall and is going to check with insurance about coverage for the tetanus vaccine.

## 2017-07-30 NOTE — Patient Instructions (Signed)
[name] , Thank you for taking time to come for your Medicare Wellness Visit. I appreciate your ongoing commitment to your health goals. Please review the following plan we discussed and let me know if I can assist you in the future.   Screening recommendations/referrals: Colonoscopy: up to date Mammogram: up to date Bone Density: N/A Recommended yearly ophthalmology/optometry visit for glaucoma screening and checkup Recommended yearly dental visit for hygiene and checkup  Vaccinations: Influenza vaccine: declined today Pneumococcal vaccine: N/A Tdap vaccine: declined Shingles vaccine: declined  Advanced directives: Advance directive discussed with you today. Even though you declined this today please call our office should you change your mind and we can give you the proper paperwork for you to fill out.  Conditions/risks identified: Obesity; Recommend cutting back on portion sizes to aid in loosing 20 lbs.   Next appointment: 09/25/17 @ 11:00 AM  Preventive Care 40-64 Years, Female Preventive care refers to lifestyle choices and visits with your health care provider that can promote health and wellness. What does preventive care include?  A yearly physical exam. This is also called an annual well check.  Dental exams once or twice a year.  Routine eye exams. Ask your health care provider how often you should have your eyes checked.  Personal lifestyle choices, including:  Daily care of your teeth and gums.  Regular physical activity.  Eating a healthy diet.  Avoiding tobacco and drug use.  Limiting alcohol use.  Practicing safe sex.  Taking low-dose aspirin daily starting at age 82.  Taking vitamin and mineral supplements as recommended by your health care provider. What happens during an annual well check? The services and screenings done by your health care provider during your annual well check will depend on your age, overall health, lifestyle risk factors,  and family history of disease. Counseling  Your health care provider may ask you questions about your:  Alcohol use.  Tobacco use.  Drug use.  Emotional well-being.  Home and relationship well-being.  Sexual activity.  Eating habits.  Work and work Statistician.  Method of birth control.  Menstrual cycle.  Pregnancy history. Screening  You may have the following tests or measurements:  Height, weight, and BMI.  Blood pressure.  Lipid and cholesterol levels. These may be checked every 5 years, or more frequently if you are over 61 years old.  Skin check.  Lung cancer screening. You may have this screening every year starting at age 53 if you have a 30-pack-year history of smoking and currently smoke or have quit within the past 15 years.  Fecal occult blood test (FOBT) of the stool. You may have this test every year starting at age 21.  Flexible sigmoidoscopy or colonoscopy. You may have a sigmoidoscopy every 5 years or a colonoscopy every 10 years starting at age 5.  Hepatitis C blood test.  Hepatitis B blood test.  Sexually transmitted disease (STD) testing.  Diabetes screening. This is done by checking your blood sugar (glucose) after you have not eaten for a while (fasting). You may have this done every 1-3 years.  Mammogram. This may be done every 1-2 years. Talk to your health care provider about when you should start having regular mammograms. This may depend on whether you have a family history of breast cancer.  BRCA-related cancer screening. This may be done if you have a family history of breast, ovarian, tubal, or peritoneal cancers.  Pelvic exam and Pap test. This may be done every 3  years starting at age 21. Starting at age 55, this may be done every 5 years if you have a Pap test in combination with an HPV test.  Bone density scan. This is done to screen for osteoporosis. You may have this scan if you are at high risk for osteoporosis. Discuss  your test results, treatment options, and if necessary, the need for more tests with your health care provider. Vaccines  Your health care provider may recommend certain vaccines, such as:  Influenza vaccine. This is recommended every year.  Tetanus, diphtheria, and acellular pertussis (Tdap, Td) vaccine. You may need a Td booster every 10 years.  Zoster vaccine. You may need this after age 73.  Pneumococcal 13-valent conjugate (PCV13) vaccine. You may need this if you have certain conditions and were not previously vaccinated.  Pneumococcal polysaccharide (PPSV23) vaccine. You may need one or two doses if you smoke cigarettes or if you have certain conditions. Talk to your health care provider about which screenings and vaccines you need and how often you need them. This information is not intended to replace advice given to you by your health care provider. Make sure you discuss any questions you have with your health care provider. Document Released: 11/19/2015 Document Revised: 07/12/2016 Document Reviewed: 08/24/2015 Elsevier Interactive Patient Education  2017 [place] Prevention in the Home Falls can cause injuries. They can happen to people of all ages. There are many things you can do to make your home safe and to help prevent falls. What can I do on the outside of my home?  Regularly fix the edges of walkways and driveways and fix any cracks.  Remove anything that might make you trip as you walk through a door, such as a raised step or threshold.  Trim any bushes or trees on the path to your home.  Use bright outdoor lighting.  Clear any walking paths of anything that might make someone trip, such as rocks or tools.  Regularly check to see if handrails are loose or broken. Make sure that both sides of any steps have handrails.  Any raised decks and porches should have guardrails on the edges.  Have any leaves, snow, or ice cleared regularly.  Use sand  or salt on walking paths during winter.  Clean up any spills in your garage right away. This includes oil or grease spills. What can I do in the bathroom?  Use night lights.  Install grab bars by the toilet and in the tub and shower. Do not use towel bars as grab bars.  Use non-skid mats or decals in the tub or shower.  If you need to sit down in the shower, use a plastic, non-slip stool.  Keep the floor dry. Clean up any water that spills on the floor as soon as it happens.  Remove soap buildup in the tub or shower regularly.  Attach bath mats securely with double-sided non-slip rug tape.  Do not have throw rugs and other things on the floor that can make you trip. What can I do in the bedroom?  Use night lights.  Make sure that you have a light by your bed that is easy to reach.  Do not use any sheets or blankets that are too big for your bed. They should not hang down onto the floor.  Have a firm chair that has side arms. You can use this for support while you get dressed.  Do not have throw rugs  and other things on the floor that can make you trip. What can I do in the kitchen?  Clean up any spills right away.  Avoid walking on wet floors.  Keep items that you use a lot in easy-to-reach places.  If you need to reach something above you, use a strong step stool that has a grab bar.  Keep electrical cords out of the way.  Do not use floor polish or wax that makes floors slippery. If you must use wax, use non-skid floor wax.  Do not have throw rugs and other things on the floor that can make you trip. What can I do with my stairs?  Do not leave any items on the stairs.  Make sure that there are handrails on both sides of the stairs and use them. Fix handrails that are broken or loose. Make sure that handrails are as long as the stairways.  Check any carpeting to make sure that it is firmly attached to the stairs. Fix any carpet that is loose or worn.  Avoid  having throw rugs at the top or bottom of the stairs. If you do have throw rugs, attach them to the floor with carpet tape.  Make sure that you have a light switch at the top of the stairs and the bottom of the stairs. If you do not have them, ask someone to add them for you. What else can I do to help prevent falls?  Wear shoes that:  Do not have high heels.  Have rubber bottoms.  Are comfortable and fit you well.  Are closed at the toe. Do not wear sandals.  If you use a stepladder:  Make sure that it is fully opened. Do not climb a closed stepladder.  Make sure that both sides of the stepladder are locked into place.  Ask someone to hold it for you, if possible.  Clearly mark and make sure that you can see:  Any grab bars or handrails.  First and last steps.  Where the edge of each step is.  Use tools that help you move around (mobility aids) if they are needed. These include:  Canes.  Walkers.  Scooters.  Crutches.  Turn on the lights when you go into a dark area. Replace any light bulbs as soon as they burn out.  Set up your furniture so you have a clear path. Avoid moving your furniture around.  If any of your floors are uneven, fix them.  If there are any pets around you, be aware of where they are.  Review your medicines with your doctor. Some medicines can make you feel dizzy. This can increase your chance of falling. Ask your doctor what other things that you can do to help prevent falls. This information is not intended to replace advice given to you by your health care provider. Make sure you discuss any questions you have with your health care provider. Document Released: 08/19/2009 Document Revised: 03/30/2016 Document Reviewed: 11/27/2014 Elsevier Interactive Patient Education  2017 [employer].

## 2017-07-31 ENCOUNTER — Telehealth: Payer: Self-pay | Admitting: Family Medicine

## 2017-07-31 NOTE — Telephone Encounter (Signed)
Called patient at 5:00pm on 07/31/17 and left voice message for patient to come in to have her blood pressure rechecked per Dr. Ancil Boozer.

## 2017-07-31 NOTE — Telephone Encounter (Signed)
She needs to come in for bp check

## 2017-09-25 ENCOUNTER — Encounter: Payer: Self-pay | Admitting: Family Medicine

## 2017-09-25 ENCOUNTER — Ambulatory Visit (INDEPENDENT_AMBULATORY_CARE_PROVIDER_SITE_OTHER): Payer: PPO | Admitting: Family Medicine

## 2017-09-25 VITALS — BP 150/90 | HR 71 | Temp 98.5°F | Resp 14 | Ht 64.0 in | Wt 189.8 lb

## 2017-09-25 DIAGNOSIS — Z9181 History of falling: Secondary | ICD-10-CM

## 2017-09-25 DIAGNOSIS — Z23 Encounter for immunization: Secondary | ICD-10-CM

## 2017-09-25 DIAGNOSIS — Z1231 Encounter for screening mammogram for malignant neoplasm of breast: Secondary | ICD-10-CM

## 2017-09-25 DIAGNOSIS — R739 Hyperglycemia, unspecified: Secondary | ICD-10-CM

## 2017-09-25 DIAGNOSIS — I10 Essential (primary) hypertension: Secondary | ICD-10-CM

## 2017-09-25 DIAGNOSIS — Z0000 Encounter for general adult medical examination without abnormal findings: Secondary | ICD-10-CM | POA: Diagnosis not present

## 2017-09-25 DIAGNOSIS — M62830 Muscle spasm of back: Secondary | ICD-10-CM

## 2017-09-25 DIAGNOSIS — E785 Hyperlipidemia, unspecified: Secondary | ICD-10-CM

## 2017-09-25 DIAGNOSIS — Z87898 Personal history of other specified conditions: Secondary | ICD-10-CM

## 2017-09-25 MED ORDER — HYDROCHLOROTHIAZIDE 12.5 MG PO TABS: 12.5000 mg | ORAL_TABLET | Freq: Every day | ORAL | 3 refills | Status: DC

## 2017-09-25 MED ORDER — BACLOFEN 20 MG PO TABS: 20.0000 mg | ORAL_TABLET | Freq: Three times a day (TID) | ORAL | 0 refills | Status: DC

## 2017-09-25 NOTE — Progress Notes (Signed)
Name: Barbara Rivers   MRN: [id number]    DOB: [birth year]   Date:09/25/2017       Progress Note  Subjective  Chief Complaint  Chief Complaint  Patient presents with  . Annual Exam    HPI  Female exam: she is married, husband had a stroke ( twice in 2015)  And they have not been sexually active.  She missed a 6 month follow up for abnormal mammogram done last Fall. No vaginal discharge. Cologuard is up to date. Bone density is up to date. Had medicare wll done already   Hypertension : she states that bp at home has been 126-130's/70's, no headache or chest pain, no dizziness. She states bp seems to go up when she is pain and usually happens when she is out of her house ( moving) Advised to follow up at least every 6 months.   Dyslipidemia: due for labs, not on medication  Hyperglycemia: last hgbA1C was 6.1% , denies polyphagia, polydipsia or polyuria.   Recent fall: she slid down the last step going down her main house stairs, she has notice right lower back pain. It happened yesterday. Taking advil without much help.    Patient Active Problem List   Diagnosis Date Noted  . VV (varicose veins) 07/26/2016  . Venous insufficiency of right leg 07/05/2016  . Obesity (BMI 30.0-34.9) 07/05/2016  . Elevated blood pressure 07/05/2016  . Right knee pain 05/31/2016  . Primary osteoarthritis of right knee 05/31/2016  . Quadriceps tendon rupture 05/31/2016  . 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 Muscle wasting and atrophy, not elsewhere classified, right thigh 05/31/2016    Past Surgical History:  Procedure Laterality Date  . CERVICAL CONIZATION W/BX    . KNEE ARTHROSCOPY  02/23/2012   Procedure: ARTHROSCOPY KNEE;  Surgeon: Alta Corning, MD;  Location: [facility];  Service: Orthopedics;  Laterality: Right;  Lateral Retinacular Release, Chondroplasty Medial Femoral Condyle and Patella, Partial Lateral Meniscectomy  . QUADRICEPS TENDON REPAIR  11/15/2012   Procedure: REPAIR QUADRICEP  TENDON;  Surgeon: Alta Corning, MD;  Location: [facility];  Service: Orthopedics;  Laterality: Right;  WITH ALLOGRAFT  . TONSILLECTOMY      Family History  Problem Relation Age of Onset  . Diabetes Father   . Kidney disease Father   . Hypertension Father   . Cancer Maternal Grandmother 35       Breast  . Breast cancer Maternal Grandmother 39  . Diabetes Paternal Grandmother     Social History   Socioeconomic History  . Marital status: Married    Spouse name: [name]  . Number of children: 1  . Years of education: Not on file  . Highest education level: Not on file  Social Needs  . Financial resource strain: Not on file  . Food insecurity - worry: Not on file  . Food insecurity - inability: Not on file  . Transportation needs - medical: Not on file  . Transportation needs - non-medical: Not on file  Occupational History  . Occupation: disabled    Comment: workman's comp   Tobacco Use  . Smoking status: Never Smoker  . Smokeless tobacco: Never Used  Substance and Sexual Activity  . Alcohol use: Yes    Comment: 1 glass of wine every several months  . Drug use: No  . Sexual activity: No  Other Topics Concern  . Not on file  Social History Narrative   She used to work but  since 2013 , had a injury at work and one year later they found out that she had quad surgery, she still has weakness , daily pain and uses a cane. Fully disabled.    Husband also had a stroke and works from home     Current Outpatient Medications:  Marland Kitchen  Glucos-Chondroit-Collag-Hyal (JOINT SUPPORT FORMULA PO), Take by mouth., Disp: , Rfl:  .  ibuprofen (ADVIL,MOTRIN) 100 MG tablet, Take 100 mg by mouth every 6 (six) hours as needed for fever., Disp: , Rfl:  .  Ketoprofen POWD, 2 g by Does not apply route every 6 (six) hours. 20 % ketoprofen, 5 % lidocaine, 5% gabapentin and 4 % ketamine, Disp: 500 g, Rfl: 0 .  Multiple Vitamin (MULTIVITAMIN) tablet, Take 1 tablet by mouth daily., Disp: ,  Rfl:  .  amitriptyline (ELAVIL) 10 MG tablet, Take 1 tablet (10 mg total) by mouth at bedtime. (Patient not taking: Reported on 07/30/2017), Disp: 90 tablet, Rfl: 1 .  pregabalin (LYRICA) 150 MG capsule, Take 150 mg by mouth daily. , Disp: , Rfl:   Allergies  Allergen Reactions  . Penicillins Hives     ROS  Constitutional: Negative for fever or weight change.  Respiratory: Negative for cough and shortness of breath.   Cardiovascular: Negative for chest pain or palpitations.  Gastrointestinal: Negative for abdominal pain, no bowel changes.  Musculoskeletal: Positive  for gait problem and right  joint swelling.  Skin: Negative for rash.  Neurological: Negative for dizziness or headache.  No other specific complaints in a complete review of systems (except as listed in HPI above).   Objective  Vitals:   09/25/17 1145  BP: (!) 150/90  Pulse: 71  Resp: 14  Temp: 98.5 F (36.9 C)  TempSrc: Oral  SpO2: 98%  Weight: 189 lb 12.8 oz (86.1 kg)  Height: 5\' 4"  (1.626 m)    Body mass index is 32.58 kg/m.  Physical Exam  Constitutional: Patient appears well-developed and well-nourished. No distress.  HENT: Head: Normocephalic and atraumatic. Ears: B TMs ok, no erythema or effusion; Nose: Nose normal. Mouth/Throat: Oropharynx is clear and moist. No oropharyngeal exudate.  Eyes: Conjunctivae and EOM are normal. Pupils are equal, round, and reactive to light. No scleral icterus.  Neck: Normal range of motion. Neck supple. No JVD present. No thyromegaly present.  Cardiovascular: Normal rate, regular rhythm and normal heart sounds.  No murmur heard. No BLE edema. Pulmonary/Chest: Effort normal and breath sounds normal. No respiratory distress. Abdominal: Soft. Bowel sounds are normal, no distension. There is no tenderness. no masses Breast: no lumps or masses, no nipple discharge or rashes FEMALE GENITALIA:  Not done RECTAL: not done Musculoskeletal: Normal range of motion, no joint  effusions. No gross deformities Neurological: he is alert and oriented to person, place, and time. No cranial nerve deficit. Coordination, balance, strength, speech and gait are normal.  Skin: Skin is warm and dry. No rash noted. No erythema.  Psychiatric: Patient has a normal mood and affect. behavior is normal. Judgment and thought content normal.  PHQ2/9: Depression screen [facility] 2/9 09/25/2017 07/30/2017 07/30/2017 07/05/2016 05/31/2016  Decreased Interest 0 0 0 0 0  Down, Depressed, Hopeless 0 1 1 0 0  PHQ - 2 Score 0 1 1 0 0  Altered sleeping - 0 - - 2  Tired, decreased energy - 0 - - 0  Change in appetite - 0 - - 1  Feeling bad or failure about yourself  - 1 - - 0  Trouble concentrating - 0 - - 0  Moving slowly or fidgety/restless - 0 - - 2  Suicidal thoughts - 0 - - 0  PHQ-9 Score - 2 - - 5  Difficult doing work/chores - Not difficult at all - - -     Fall Risk: Fall Risk  09/25/2017 07/30/2017 07/05/2016  Falls in the past year? Yes No No  Number falls in past yr: 1 - -  Injury with Fall? Yes - -  Risk Factor Category  High Fall Risk - -  Risk for fall due to : Impaired balance/gait - -     Functional Status Survey: Is the patient deaf or have difficulty hearing?: No Does the patient have difficulty seeing, even when wearing glasses/contacts?: No Does the patient have difficulty concentrating, remembering, or making decisions?: No Does the patient have difficulty walking or climbing stairs?: Yes Does the patient have difficulty dressing or bathing?: No Does the patient have difficulty doing errands alone such as visiting a doctor's office or shopping?: No   Assessment & Plan  1. Physical exam  Discussed importance of 150 minutes of physical activity weekly, eat two servings of fish weekly, eat one serving of tree nuts ( cashews, pistachios, pecans, almonds.Marland Kitchen) every other day, eat 6 servings of fruit/vegetables daily and drink plenty of water and avoid sweet beverages.    2. Flu vaccine need  - Flu Vaccine QUAD 36+ mos IM  3. Encounter for screening mammogram for breast cancer  - MM DIGITAL SCREENING BILATERAL; Future - US BREAST LTD UNI LEFT INC AXILLA; Future - US BREAST LTD UNI RIGHT INC AXILLA; Future  4. Hypertension, benign  - hydrochlorothiazide (HYDRODIURIL) 12.5 MG tablet; Take 1 tablet (12.5 mg total) by mouth daily.  Dispense: 90 tablet; Refill: 3 - COMPLETE METABOLIC PANEL WITH GFR - CBC with Differential/Platelet - TSH - Urine Microalbumin w/creat. ratio  5. H/O abnormal mammogram  - MM DIGITAL SCREENING BILATERAL; Future - US BREAST LTD UNI LEFT INC AXILLA; Future - US BREAST LTD UNI RIGHT INC AXILLA; Future  6. Dyslipidemia  - Lipid panel  7. Hyperglycemia  - Hemoglobin A1c  8. History of recent fall  - baclofen (LIORESAL) 20 MG tablet; Take 1 tablet (20 mg total) by mouth 3 (three) times daily.  Dispense: 30 each; Refill: 0  9. Spasm of muscle of lower back  - baclofen (LIORESAL) 20 MG tablet; Take 1 tablet (20 mg total) by mouth 3 (three) times daily.  Dispense: 30 each; Refill: 0

## 2017-09-25 NOTE — Patient Instructions (Signed)
Preventive Care 40-64 Years, Female Preventive care refers to lifestyle choices and visits with your health care provider that can promote health and wellness. What does preventive care include?  A yearly physical exam. This is also called an annual well check.  Dental exams once or twice a year.  Routine eye exams. Ask your health care provider how often you should have your eyes checked.  Personal lifestyle choices, including: ? Daily care of your teeth and gums. ? Regular physical activity. ? Eating a healthy diet. ? Avoiding tobacco and drug use. ? Limiting alcohol use. ? Practicing safe sex. ? Taking low-dose aspirin daily starting at age 58. ? Taking vitamin and mineral supplements as recommended by your health care provider. What happens during an annual well check? The services and screenings done by your health care provider during your annual well check will depend on your age, overall health, lifestyle risk factors, and family history of disease. Counseling Your health care provider may ask you questions about your:  Alcohol use.  Tobacco use.  Drug use.  Emotional well-being.  Home and relationship well-being.  Sexual activity.  Eating habits.  Work and work Statistician.  Method of birth control.  Menstrual cycle.  Pregnancy history.  Screening You may have the following tests or measurements:  Height, weight, and BMI.  Blood pressure.  Lipid and cholesterol levels. These may be checked every 5 years, or more frequently if you are over 81 years old.  Skin check.  Lung cancer screening. You may have this screening every year starting at age 78 if you have a 30-pack-year history of smoking and currently smoke or have quit within the past 15 years.  Fecal occult blood test (FOBT) of the stool. You may have this test every year starting at age 65.  Flexible sigmoidoscopy or colonoscopy. You may have a sigmoidoscopy every 5 years or a colonoscopy  every 10 years starting at age 30.  Hepatitis C blood test.  Hepatitis B blood test.  Sexually transmitted disease (STD) testing.  Diabetes screening. This is done by checking your blood sugar (glucose) after you have not eaten for a while (fasting). You may have this done every 1-3 years.  Mammogram. This may be done every 1-2 years. Talk to your health care provider about when you should start having regular mammograms. This may depend on whether you have a family history of breast cancer.  BRCA-related cancer screening. This may be done if you have a family history of breast, ovarian, tubal, or peritoneal cancers.  Pelvic exam and Pap test. This may be done every 3 years starting at age 80. Starting at age 36, this may be done every 5 years if you have a Pap test in combination with an HPV test.  Bone density scan. This is done to screen for osteoporosis. You may have this scan if you are at high risk for osteoporosis.  Discuss your test results, treatment options, and if necessary, the need for more tests with your health care provider. Vaccines Your health care provider may recommend certain vaccines, such as:  Influenza vaccine. This is recommended every year.  Tetanus, diphtheria, and acellular pertussis (Tdap, Td) vaccine. You may need a Td booster every 10 years.  Varicella vaccine. You may need this if you have not been vaccinated.  Zoster vaccine. You may need this after age 5.  Measles, mumps, and rubella (MMR) vaccine. You may need at least one dose of MMR if you were born in  1956 or later. You may also need a second dose.  Pneumococcal 13-valent conjugate (PCV13) vaccine. You may need this if you have certain conditions and were not previously vaccinated.  Pneumococcal polysaccharide (PPSV23) vaccine. You may need one or two doses if you smoke cigarettes or if you have certain conditions.  Meningococcal vaccine. You may need this if you have certain  conditions.  Hepatitis A vaccine. You may need this if you have certain conditions or if you travel or work in places where you may be exposed to hepatitis A.  Hepatitis B vaccine. You may need this if you have certain conditions or if you travel or work in places where you may be exposed to hepatitis B.  Haemophilus influenzae type b (Hib) vaccine. You may need this if you have certain conditions.  Talk to your health care provider about which screenings and vaccines you need and how often you need them. This information is not intended to replace advice given to you by your health care provider. Make sure you discuss any questions you have with your health care provider. Document Released: 11/19/2015 Document Revised: 07/12/2016 Document Reviewed: 08/24/2015 Elsevier Interactive Patient Education  2017 [employer].

## 2017-10-11 ENCOUNTER — Ambulatory Visit (INDEPENDENT_AMBULATORY_CARE_PROVIDER_SITE_OTHER): Payer: Self-pay | Admitting: Family Medicine

## 2017-10-11 ENCOUNTER — Encounter: Payer: Self-pay | Admitting: Family Medicine

## 2017-10-11 VITALS — BP 130/60 | HR 73 | Resp 14 | Ht 64.0 in | Wt 188.8 lb

## 2017-10-11 DIAGNOSIS — M62551 Muscle wasting and atrophy, not elsewhere classified, right thigh: Secondary | ICD-10-CM

## 2017-10-11 DIAGNOSIS — M25561 Pain in right knee: Secondary | ICD-10-CM

## 2017-10-11 DIAGNOSIS — G8929 Other chronic pain: Secondary | ICD-10-CM

## 2017-10-11 DIAGNOSIS — Y990 Civilian activity done for income or pay: Secondary | ICD-10-CM

## 2017-10-11 DIAGNOSIS — R252 Cramp and spasm: Secondary | ICD-10-CM

## 2017-10-11 MED ORDER — KETOPROFEN POWD: 2.0000 g | Freq: Four times a day (QID) | 3 refills | Status: DC

## 2017-10-11 MED ORDER — BACLOFEN 20 MG PO TABS: 20.0000 mg | ORAL_TABLET | Freq: Three times a day (TID) | ORAL | 0 refills | Status: DC

## 2017-10-11 NOTE — Progress Notes (Signed)
Name: Barbara Rivers   MRN: [id number]    DOB: [birth year]   Date:10/11/2017       Progress Note  Subjective  Chief Complaint  Chief Complaint  Patient presents with  . Knee Pain    HPI   Work related injury/chronic right knee pain/CRPS: she is still having daily pain on right knee, decrease rom , uses a cane for ambulation, off oral nsaid's and Lyrica. She still has some swelling on right thigh and also atrophy of thigh and right leg weakness. Unable to work secondary to pain and has social security disability. Injury happened 07/2011, settlement 2018. Surgery was in 2013 and quadriceps tendon repair 2014 after a rupture. She states topical medication helps with pain, she has side effects of oral medication. She took Baclofen for her back recently and noticed help with right quad spasm, she no longer has back pain. She is using Newstep at home. She states water activity helps a lot with her symptoms and will try to continue water activity during the winter.    Patient Active Problem List   Diagnosis Date Noted  . VV (varicose veins) 07/26/2016  . Venous insufficiency of right leg 07/05/2016  . Obesity (BMI 30.0-34.9) 07/05/2016  . Elevated blood pressure 07/05/2016  . Right knee pain 05/31/2016  . Primary osteoarthritis of right knee 05/31/2016  . Quadriceps tendon rupture 05/31/2016  . 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 Muscle wasting and atrophy, not elsewhere classified, right thigh 05/31/2016    Social History   Tobacco Use  . Smoking status: Never Smoker  . Smokeless tobacco: Never Used  Substance Use Topics  . Alcohol use: Yes    Comment: 1 glass of wine every several months     Current Outpatient Medications:  Marland Kitchen  Glucos-Chondroit-Collag-Hyal (JOINT SUPPORT FORMULA PO), Take by mouth., Disp: , Rfl:  .  ibuprofen (ADVIL,MOTRIN) 100 MG tablet, Take 100 mg by mouth every 6 (six) hours as needed for fever., Disp: , Rfl:  .  Ketoprofen POWD, 2 g by Does not apply route every 6  (six) hours. 20 % ketoprofen, 5 % lidocaine, 5% gabapentin and 4 % ketamine, Disp: 500 g, Rfl: 0 .  Multiple Vitamin (MULTIVITAMIN) tablet, Take 1 tablet by mouth daily., Disp: , Rfl:  .  baclofen (LIORESAL) 20 MG tablet, Take 1 tablet (20 mg total) by mouth 3 (three) times daily. (Patient not taking: Reported on 10/11/2017), Disp: 30 each, Rfl: 0  Allergies  Allergen Reactions  . Penicillins Hives    ROS  Ten systems reviewed and is negative except as mentioned in HPI   Objective  Vitals:   10/11/17 1119  BP: 130/60  Pulse: 73  Resp: 14  SpO2: 98%  Weight: 188 lb 12.8 oz (85.6 kg)  Height: 5\' 4"  (1.626 m)    Body mass index is 32.41 kg/m.   Physical Exam  Constitutional: Patient appears well-developed and well-nourished. Obese  No distress.  HEENT: head atraumatic, normocephalic, pupils equal and reactive to light,  neck supple, throat within normal limits Cardiovascular: Normal rate, regular rhythm and normal heart sounds.  No murmur heard. No BLE edema. Pulmonary/Chest: Effort normal and breath sounds normal. No respiratory distress. Abdominal: Soft.  There is no tenderness. Psychiatric: Patient has a normal mood and affect. behavior is normal. Judgment and thought content normal. Muscular Skeletal: scar well healed from surgery, atrophy of right quad muscle, decrease rom   Assessment & Plan  1. Work related injury  - Ketoprofen POWD;  2 g by Does not apply route every 6 (six) hours. 20 % ketoprofen, 5 % lidocaine, 5% gabapentin and 4 % ketamine  Dispense: 500 g; Refill: 3 - baclofen (LIORESAL) 20 MG tablet; Take 1 tablet (20 mg total) by mouth 3 (three) times daily.  Dispense: 90 each; Refill: 0  2. Chronic pain of right knee  - Ketoprofen POWD; 2 g by Does not apply route every 6 (six) hours. 20 % ketoprofen, 5 % lidocaine, 5% gabapentin and 4 % ketamine  Dispense: 500 g; Refill: 3 - baclofen (LIORESAL) 20 MG tablet; Take 1 tablet (20 mg total) by mouth 3 (three)  times daily.  Dispense: 90 each; Refill: 0  3. Muscle wasting and atrophy, not elsewhere classified, right thigh  - Ketoprofen POWD; 2 g by Does not apply route every 6 (six) hours. 20 % ketoprofen, 5 % lidocaine, 5% gabapentin and 4 % ketamine  Dispense: 500 g; Refill: 3 - baclofen (LIORESAL) 20 MG tablet; Take 1 tablet (20 mg total) by mouth 3 (three) times daily.  Dispense: 90 each; Refill: 0  4. Spasm  - Ketoprofen POWD; 2 g by Does not apply route every 6 (six) hours. 20 % ketoprofen, 5 % lidocaine, 5% gabapentin and 4 % ketamine  Dispense: 500 g; Refill: 3 - baclofen (LIORESAL) 20 MG tablet; Take 1 tablet (20 mg total) by mouth 3 (three) times daily.  Dispense: 90 each; Refill: 0

## 2017-11-20 ENCOUNTER — Ambulatory Visit
Admission: RE | Admit: 2017-11-20 | Discharge: 2017-11-20 | Disposition: A | Discharge status: Home / Self Care | Payer: PPO | Source: Ambulatory Visit | Attending: Family Medicine | Admitting: Family Medicine

## 2017-11-20 DIAGNOSIS — Z87898 Personal history of other specified conditions: Secondary | ICD-10-CM

## 2017-11-20 DIAGNOSIS — Z1231 Encounter for screening mammogram for malignant neoplasm of breast: Secondary | ICD-10-CM

## 2017-11-20 DIAGNOSIS — R928 Other abnormal and inconclusive findings on diagnostic imaging of breast: Secondary | ICD-10-CM | POA: Diagnosis not present

## 2017-11-20 DIAGNOSIS — N6323 Unspecified lump in the left breast, lower outer quadrant: Secondary | ICD-10-CM | POA: Diagnosis not present

## 2017-11-20 DIAGNOSIS — N6312 Unspecified lump in the right breast, upper inner quadrant: Secondary | ICD-10-CM | POA: Diagnosis not present

## 2018-03-25 ENCOUNTER — Ambulatory Visit (INDEPENDENT_AMBULATORY_CARE_PROVIDER_SITE_OTHER): Payer: Self-pay | Admitting: Family Medicine

## 2018-03-25 ENCOUNTER — Encounter: Payer: Self-pay | Admitting: Family Medicine

## 2018-03-25 VITALS — BP 136/64 | HR 85 | Temp 98.7°F | Resp 16 | Ht 64.0 in | Wt 194.9 lb

## 2018-03-25 DIAGNOSIS — M25561 Pain in right knee: Secondary | ICD-10-CM

## 2018-03-25 DIAGNOSIS — G8929 Other chronic pain: Secondary | ICD-10-CM

## 2018-03-25 DIAGNOSIS — Y990 Civilian activity done for income or pay: Secondary | ICD-10-CM

## 2018-03-25 DIAGNOSIS — R252 Cramp and spasm: Secondary | ICD-10-CM

## 2018-03-25 DIAGNOSIS — M62551 Muscle wasting and atrophy, not elsewhere classified, right thigh: Secondary | ICD-10-CM

## 2018-03-25 MED ORDER — BACLOFEN 10 MG PO TABS: 10.0000 mg | ORAL_TABLET | Freq: Three times a day (TID) | ORAL | 0 refills | Status: DC

## 2018-03-25 MED ORDER — DICLOFENAC SODIUM 1 % TD GEL: 4.0000 g | Freq: Four times a day (QID) | TRANSDERMAL | 2 refills | Status: DC

## 2018-03-25 NOTE — Progress Notes (Signed)
Name: Barbara Rivers   MRN: [id number]    DOB: 1956-08-20   Date:03/25/2018       Progress Note  Subjective  Chief Complaint  Chief Complaint  Patient presents with  . Knee Pain    HPI  Work related injury/chronic right knee pain/CRPS: she is still having daily pain on right knee, decrease rom , uses a cane for ambulation, off oral nsaid's and Lyrica. She still has some swelling on right thigh and also atrophy of thigh and right leg weakness. Unable to work secondary to pain and has social security disability. Injury happened 07/2011, settlement 2018. Surgery was in 2013 and quadriceps tendon repair 2014 after a rupture. She states topical medication helps with pain, she has side effects of oral medication. She took Baclofen for her back recently and noticed help with right quad spasm, she states that the dose seems to strong and we will go down to avoid falls. She is using Newstep at home. She states water activity helps a lot with her symptoms and will try to continue water activity during the winter. She is also paying cash for massage therapy because it seems to help more than other medication She states compound medication no longer covered, so we will try Voltaren gel   Patient Active Problem List   Diagnosis Date Noted  . VV (varicose veins) 07/26/2016  . Venous insufficiency of right leg 07/05/2016  . Obesity (BMI 30.0-34.9) 07/05/2016  . Elevated blood pressure 07/05/2016  . Right knee pain 05/31/2016  . Primary osteoarthritis of right knee 05/31/2016  . Quadriceps tendon rupture 05/31/2016  . 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 Muscle wasting and atrophy, not elsewhere classified, right thigh 05/31/2016    Social History   Tobacco Use  . Smoking status: Never Smoker  . Smokeless tobacco: Never Used  Substance Use Topics  . Alcohol use: Yes    Comment: 1 glass of wine every several months     Current Outpatient Medications:  .  baclofen (LIORESAL) 20 MG tablet, Take 1 tablet  (20 mg total) by mouth 3 (three) times daily. (Patient taking differently: Take 20 mg by mouth as needed. ), Disp: 90 each, Rfl: 0 .  Glucos-Chondroit-Collag-Hyal (JOINT SUPPORT FORMULA PO), Take by mouth., Disp: , Rfl:  .  ibuprofen (ADVIL,MOTRIN) 100 MG tablet, Take 100 mg by mouth every 6 (six) hours as needed for fever., Disp: , Rfl:  .  Ketoprofen POWD, 2 g by Does not apply route every 6 (six) hours. 20 % ketoprofen, 5 % lidocaine, 5% gabapentin and 4 % ketamine, Disp: 500 g, Rfl: 3 .  Multiple Vitamin (MULTIVITAMIN) tablet, Take 1 tablet by mouth daily., Disp: , Rfl:   Allergies  Allergen Reactions  . Penicillins Hives    ROS  Ten systems reviewed and is negative except as mentioned in HPI   Objective  Vitals:   03/25/18 1205  BP: 136/64  Pulse: 85  Resp: 16  Temp: 98.7 F (37.1 C)  TempSrc: Oral  SpO2: 97%  Weight: 194 lb 14.4 oz (88.4 kg)  Height: 5\' 4"  (1.626 m)    Body mass index is 33.45 kg/m.   Physical Exam  Constitutional: Patient appears well-developed and well-nourished. Obese No distress.  HEENT: head atraumatic, normocephalic, pupils equal and reactive to light,neck supple, throat within normal limits Cardiovascular: Normal rate, regular rhythm and normal heart sounds.  No murmur heard. No BLE edema. Pulmonary/Chest: Effort normal and breath sounds normal. No respiratory distress. Abdominal: Soft.  There is no tenderness. Psychiatric: Patient has a normal mood and affect. behavior is normal. Judgment and thought content normal.   Assessment & Plan  1. Work related injury  Using Medicare set aside to pay for the cost now  2. Chronic pain of right knee  - diclofenac sodium (VOLTAREN) 1 % GEL; Apply 4 g topically 4 (four) times daily.  Dispense: 300 g; Refill: 2  3. Muscle wasting and atrophy, not elsewhere classified, right thigh  - diclofenac sodium (VOLTAREN) 1 % GEL; Apply 4 g topically 4 (four) times daily.  Dispense: 300 g; Refill: 2  4.  Spasm  Decrease dose of baclofen as requested  - baclofen (LIORESAL) 10 MG tablet; Take 1 tablet (10 mg total) by mouth 3 (three) times daily.  Dispense: 90 tablet; Refill: 0 - diclofenac sodium (VOLTAREN) 1 % GEL; Apply 4 g topically 4 (four) times daily.  Dispense: 300 g; Refill: 2

## 2018-09-25 ENCOUNTER — Encounter: Payer: Self-pay | Admitting: Family Medicine

## 2018-09-25 ENCOUNTER — Ambulatory Visit: Payer: Self-pay | Admitting: Family Medicine

## 2018-09-25 VITALS — BP 136/74 | HR 85 | Temp 97.7°F | Resp 16 | Ht 64.0 in | Wt 199.9 lb

## 2018-09-25 DIAGNOSIS — G8929 Other chronic pain: Secondary | ICD-10-CM | POA: Diagnosis not present

## 2018-09-25 DIAGNOSIS — Y990 Civilian activity done for income or pay: Secondary | ICD-10-CM

## 2018-09-25 DIAGNOSIS — R252 Cramp and spasm: Secondary | ICD-10-CM

## 2018-09-25 DIAGNOSIS — M25561 Pain in right knee: Secondary | ICD-10-CM

## 2018-09-25 DIAGNOSIS — M62551 Muscle wasting and atrophy, not elsewhere classified, right thigh: Secondary | ICD-10-CM | POA: Diagnosis not present

## 2018-09-25 MED ORDER — DICLOFENAC SODIUM 1 % TD GEL: 4.0000 g | Freq: Four times a day (QID) | TRANSDERMAL | 2 refills | Status: DC

## 2018-09-25 MED ORDER — BACLOFEN 10 MG PO TABS: 10.0000 mg | ORAL_TABLET | Freq: Three times a day (TID) | ORAL | 1 refills | Status: DC | PRN

## 2018-09-25 NOTE — Progress Notes (Signed)
Name: Barbara Rivers   MRN: [id number]    DOB: [birth year]   Date:09/25/2018       Progress Note  Subjective  Chief Complaint  Chief Complaint  Patient presents with  . Work related injury/chronic right knee pain/CRPS:    HPI  Work related injury/chronic right knee pain/CRPS: she is still having daily pain on right knee, decrease rom , uses a cane for ambulation, off oral nsaid's and Lyrica.She still has some swelling on right thigh and also atrophy of thigh and right leg weakness.Unable to work secondary to pain and has social security disability. Injury happened 07/2011, settlement 2018. Surgery was in 2013 and quadriceps tendon repair 2014 after a rupture. She states topical medication helps with pain, she has side effects of oral medication. She states Baclofen helps with muscle spasm of her right quad. She is using Newstep at home and feels like it is helping with her hamstring strenght.She states water activity helps a lot with her symptoms and will try to continue water activity during the winter.She is also paying cash for massage therapy because it seems to help more than other medication  Voltaren also helps with symptoms.    Patient Active Problem List   Diagnosis Date Noted  . VV (varicose veins) 07/26/2016  . Venous insufficiency of right leg 07/05/2016  . Obesity (BMI 30.0-34.9) 07/05/2016  . Elevated blood pressure 07/05/2016  . Right knee pain 05/31/2016  . Primary osteoarthritis of right knee 05/31/2016  . Quadriceps tendon rupture 05/31/2016  . 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 Muscle wasting and atrophy, not elsewhere classified, right thigh 05/31/2016    Past Surgical History:  Procedure Laterality Date  . CERVICAL CONIZATION W/BX    . KNEE ARTHROSCOPY  02/23/2012   Procedure: ARTHROSCOPY KNEE;  Surgeon: Alta Corning, MD;  Location: [place];  Service: Orthopedics;  Laterality: Right;  Lateral Retinacular Release, Chondroplasty Medial Femoral Condyle  and Patella, Partial Lateral Meniscectomy  . QUADRICEPS TENDON REPAIR  11/15/2012   Procedure: REPAIR QUADRICEP TENDON;  Surgeon: Alta Corning, MD;  Location: [facility];  Service: Orthopedics;  Laterality: Right;  WITH ALLOGRAFT  . TONSILLECTOMY      Family History  Problem Relation Age of Onset  . Diabetes Father   . Kidney disease Father   . Hypertension Father   . Cancer Maternal Grandmother 67       Breast  . Breast cancer Maternal Grandmother 77  . Diabetes Paternal Grandmother     Social History   Socioeconomic History  . Marital status: Married    Spouse name: [name]  . Number of children: 1  . Years of education: Not on file  . Highest education level: Not on file  Occupational History  . Occupation: disabled    Comment: Warehouse manager comp   Social Needs  . Financial resource strain: Hard  . Food insecurity:    Worry: Never true    Inability: Never true  . Transportation needs:    Medical: No    Non-medical: No  Tobacco Use  . Smoking status: Never Smoker  . Smokeless tobacco: Never Used  Substance and Sexual Activity  . Alcohol use: Yes    Comment: 1 glass of wine every several months  . Drug use: No  . Sexual activity: Never  Lifestyle  . Physical activity:    Days per week: Not on file    Minutes per session: Not on file  . Stress: Not at  all  Relationships  . Social connections:    Talks on phone: Not on file    Gets together: Not on file    Attends religious service: Not on file    Active member of club or organization: Not on file    Attends meetings of clubs or organizations: Not on file    Relationship status: Not on file  . Intimate partner violence:    Fear of current or ex partner: No    Emotionally abused: No    Physically abused: No    Forced sexual activity: No  Other Topics Concern  . Not on file  Social History Narrative   She used to work but since 2013 , had a injury at work and one year later they found out that  she had quad surgery, she still has weakness , daily pain and uses a cane. Fully disabled.    Husband also had a stroke in 2015  and works from home now      Current Outpatient Medications:  .  baclofen (LIORESAL) 10 MG tablet, Take 1 tablet (10 mg total) by mouth 3 (three) times daily as needed for muscle spasms., Disp: 90 tablet, Rfl: 1 .  diclofenac sodium (VOLTAREN) 1 % GEL, Apply 4 g topically 4 (four) times daily., Disp: 300 g, Rfl: 2 .  Glucos-Chondroit-Collag-Hyal (JOINT SUPPORT FORMULA PO), Take by mouth., Disp: , Rfl:  .  ibuprofen (ADVIL,MOTRIN) 100 MG tablet, Take 100 mg by mouth every 6 (six) hours as needed for fever., Disp: , Rfl:  .  Multiple Vitamin (MULTIVITAMIN) tablet, Take 1 tablet by mouth daily., Disp: , Rfl:   Allergies  Allergen Reactions  . Penicillins Hives    I personally reviewed active problem list, medication list, allergies, family history, social history with the patient/caregiver today.   ROS  Ten systems reviewed and is negative except as mentioned in HPI   Objective  Vitals:   09/25/18 1205  BP: 136/74  Pulse: 85  Resp: 16  Temp: 97.7 F (36.5 C)  TempSrc: Oral  SpO2: 99%  Weight: 199 lb 14.4 oz (90.7 kg)  Height: 5\' 4"  (1.626 m)    Body mass index is 34.31 kg/m.  Physical Exam  Constitutional: Patient appears well-developed and well-nourished. Obese No distress.  HEENT: head atraumatic, normocephalic, pupils equal and reactive to light, neck supple, throat within normal limits Cardiovascular: Normal rate, regular rhythm and normal heart sounds.  No murmur heard. No BLE edema. Pulmonary/Chest: Effort normal and breath sounds normal. No respiratory distress. Abdominal: Soft.  There is no tenderness. Skin: right finger wart  Muscular Skeletal: still has deformity on right knee unable to flex beyond 90 degree and can extend about 30 degrees forward , pain with rom, and muscle atrophy right thigh medially. ,   Psychiatric: Patient has  a normal mood and affect. behavior is normal. Judgment and thought content normal.  PHQ2/9: Depression screen [facility] 2/9 09/25/2018 03/25/2018 09/25/2017 07/30/2017 07/30/2017  Decreased Interest 0 0 0 0 0  Down, Depressed, Hopeless 0 0 0 1 1  PHQ - 2 Score 0 0 0 1 1  Altered sleeping 1 1 - 0 -  Tired, decreased energy 1 1 - 0 -  Change in appetite 0 0 - 0 -  Feeling bad or failure about yourself  0 0 - 1 -  Trouble concentrating 0 0 - 0 -  Moving slowly or fidgety/restless 1 - - 0 -  Suicidal thoughts 0 0 - 0 -  PHQ-9 Score 3 2 - 2 -  Difficult doing work/chores Not difficult at all - - Not difficult at all -     Fall Risk: Fall Risk  09/25/2018 10/11/2017 09/25/2017 07/30/2017 07/05/2016  Falls in the past year? 0 No Yes No No  Number falls in past yr: 0 - 1 - -  Injury with Fall? 0 - Yes - -  Risk Factor Category  - - High Fall Risk - -  Risk for fall due to : - - Impaired balance/gait - -     Functional Status Survey: Is the patient deaf or have difficulty hearing?: No Does the patient have difficulty seeing, even when wearing glasses/contacts?: Yes(glasses) Does the patient have difficulty concentrating, remembering, or making decisions?: No Does the patient have difficulty walking or climbing stairs?: Yes Does the patient have difficulty dressing or bathing?: No Does the patient have difficulty doing errands alone such as visiting a doctor's office or shopping?: No    Assessment & Plan  1. Work related injury   2. Chronic pain of right knee  - diclofenac sodium (VOLTAREN) 1 % GEL; Apply 4 g topically 4 (four) times daily.  Dispense: 300 g; Refill: 2 - baclofen (LIORESAL) 10 MG tablet; Take 1 tablet (10 mg total) by mouth 3 (three) times daily as needed for muscle spasms.  Dispense: 90 tablet; Refill: 1  3. Muscle wasting and atrophy, not elsewhere classified, right thigh  - diclofenac sodium (VOLTAREN) 1 % GEL; Apply 4 g topically 4 (four) times daily.  Dispense: 300  g; Refill: 2 - baclofen (LIORESAL) 10 MG tablet; Take 1 tablet (10 mg total) by mouth 3 (three) times daily as needed for muscle spasms.  Dispense: 90 tablet; Refill: 1  4. Spasm  - diclofenac sodium (VOLTAREN) 1 % GEL; Apply 4 g topically 4 (four) times daily.  Dispense: 300 g; Refill: 2 - baclofen (LIORESAL) 10 MG tablet; Take 1 tablet (10 mg total) by mouth 3 (three) times daily as needed for muscle spasms.  Dispense: 90 tablet; Refill: 1

## 2018-12-10 ENCOUNTER — Telehealth: Payer: Self-pay | Admitting: Family Medicine

## 2018-12-10 NOTE — Telephone Encounter (Signed)
I left a message asking the patient to call and schedule AWV-S with Nurse Health Advisor, [place].  (Last AWV 07/30/17).  If the patient calls back, please schedule Medicare AWV-S with Nurse Health Advisor. VDM (DD)

## 2019-03-07 ENCOUNTER — Ambulatory Visit: Payer: PPO

## 2019-03-07 ENCOUNTER — Encounter: Payer: PPO | Admitting: Family Medicine

## 2019-03-25 ENCOUNTER — Encounter: Payer: Self-pay | Admitting: Family Medicine

## 2019-03-25 ENCOUNTER — Other Ambulatory Visit: Payer: Self-pay

## 2019-03-25 ENCOUNTER — Ambulatory Visit (INDEPENDENT_AMBULATORY_CARE_PROVIDER_SITE_OTHER): Payer: PPO | Admitting: Family Medicine

## 2019-03-25 DIAGNOSIS — M25561 Pain in right knee: Secondary | ICD-10-CM | POA: Diagnosis not present

## 2019-03-25 DIAGNOSIS — R252 Cramp and spasm: Secondary | ICD-10-CM

## 2019-03-25 DIAGNOSIS — G8929 Other chronic pain: Secondary | ICD-10-CM | POA: Diagnosis not present

## 2019-03-25 DIAGNOSIS — M62551 Muscle wasting and atrophy, not elsewhere classified, right thigh: Secondary | ICD-10-CM | POA: Diagnosis not present

## 2019-03-25 MED ORDER — BACLOFEN 10 MG PO TABS: 10.0000 mg | ORAL_TABLET | Freq: Three times a day (TID) | ORAL | 1 refills | Status: DC | PRN

## 2019-03-25 MED ORDER — DICLOFENAC SODIUM 1 % TD GEL: 4.0000 g | Freq: Four times a day (QID) | TRANSDERMAL | 2 refills | Status: DC

## 2019-03-25 MED ORDER — NAPROXEN 500 MG PO TABS: 500.0000 mg | ORAL_TABLET | Freq: Two times a day (BID) | ORAL | 0 refills | Status: DC | PRN

## 2019-03-25 NOTE — Progress Notes (Signed)
Name: Barbara Rivers   MRN: [id number]    DOB: 07/31/1956   Date:03/25/2019       Progress Note  Subjective  Chief Complaint  Chief Complaint  Patient presents with  . Follow-up    no ins not allowed to use  . Knee Pain    right , failed surgries with permanet disability    I connected with  Barbara Rivers  on 03/25/19 at 11:40 AM EDT by a video enabled telemedicine application and verified that I am speaking with the correct person using two identifiers.  I discussed the limitations of evaluation and management by telemedicine and the availability of in person appointments. The patient expressed understanding and agreed to proceed. Staff also discussed with the patient that there may be a patient responsible charge related to this service. Patient Location: at home Provider Location: [facility]   HPI  Work related injury/chronic right knee pain/CRPS: she is still having daily pain on right knee, decrease rom , uses a cane for ambulation and when groceries shopping has to use the store cart for support, she was  off oral nsaid's and Lyrica, however since unable to have massage she has resumed oral nsaids to control swelling and pain. .She has atrophy of thigh and right leg weakness.Unable to work secondary to pain and has social security disability. Injury happened 07/2011, settlement 2018. Surgery was in 2013 and quadriceps tendon repair 2014 after a rupture. She states topical medication helps with pain, she has side effects of oral medication but we will try rx to take prn only when in severe discomfort and to take it with food . She states Baclofen helps with muscle spasm of her right quad.She is using Newstep at home and feels like it is helping with her hamstring strength, but unable to use for more than 10 minutes each time  if knee is swollen.She states water activity helps a lot with her symptoms but her pool water is too cold for her at this time.  She states  that since COVID-19 she has been unable to have massage therapy and has noticed more inflammation on her knee and is having more effusion Pain level right now is 6/10, she is surprised since it is raining outside.   Patient Active Problem List   Diagnosis Date Noted  . VV (varicose veins) 07/26/2016  . Venous insufficiency of right leg 07/05/2016  . Obesity (BMI 30.0-34.9) 07/05/2016  . Elevated blood pressure 07/05/2016  . Right knee pain 05/31/2016  . Primary osteoarthritis of right knee 05/31/2016  . Quadriceps tendon rupture 05/31/2016  . 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 Muscle wasting and atrophy, not elsewhere classified, right thigh 05/31/2016    Past Surgical History:  Procedure Laterality Date  . CERVICAL CONIZATION W/BX    . KNEE ARTHROSCOPY  02/23/2012   Procedure: ARTHROSCOPY KNEE;  Surgeon: Alta Corning, MD;  Location: [place];  Service: Orthopedics;  Laterality: Right;  Lateral Retinacular Release, Chondroplasty Medial Femoral Condyle and Patella, Partial Lateral Meniscectomy  . QUADRICEPS TENDON REPAIR  11/15/2012   Procedure: REPAIR QUADRICEP TENDON;  Surgeon: Alta Corning, MD;  Location: [place];  Service: Orthopedics;  Laterality: Right;  WITH ALLOGRAFT  . TONSILLECTOMY      Family History  Problem Relation Age of Onset  . Diabetes Father   . Kidney disease Father   . Hypertension Father   . Cancer Maternal Grandmother 7  Breast  . Breast cancer Maternal Grandmother 80  . Diabetes Paternal Grandmother     Social History   Socioeconomic History  . Marital status: Married    Spouse name: Barbara Rivers  . Number of children: 1  . Years of education: Not on file  . Highest education level: Not on file  Occupational History  . Occupation: disabled    Comment: Warehouse manager comp   Social Needs  . Financial resource strain: Hard  . Food insecurity:    Worry: Never true    Inability: Never true  . Transportation needs:     Medical: No    Non-medical: No  Tobacco Use  . Smoking status: Never Smoker  . Smokeless tobacco: Never Used  Substance and Sexual Activity  . Alcohol use: Yes    Comment: 1 glass of wine every several months  . Drug use: No  . Sexual activity: Never  Lifestyle  . Physical activity:    Days per week: 7 days    Minutes per session: 20 min  . Stress: Not at all  Relationships  . Social connections:    Talks on phone: Not on file    Gets together: Not on file    Attends religious service: Not on file    Active member of club or organization: Not on file    Attends meetings of clubs or organizations: Not on file    Relationship status: Not on file  . Intimate partner violence:    Fear of current or ex partner: No    Emotionally abused: No    Physically abused: No    Forced sexual activity: No  Other Topics Concern  . Not on file  Social History Narrative   She used to work but since 2013 , had a injury at work and one year later they found out that she had quad surgery, she still has weakness , daily pain and uses a cane. Fully disabled.    Husband also had a stroke in 2015  and works from home now      Current Outpatient Medications:  .  baclofen (LIORESAL) 10 MG tablet, Take 1 tablet (10 mg total) by mouth 3 (three) times daily as needed for muscle spasms., Disp: 90 tablet, Rfl: 1 .  diclofenac sodium (VOLTAREN) 1 % GEL, Apply 4 g topically 4 (four) times daily., Disp: 300 g, Rfl: 2 .  Glucos-Chondroit-Collag-Hyal (JOINT SUPPORT FORMULA PO), Take by mouth., Disp: , Rfl:  .  Multiple Vitamin (MULTIVITAMIN) tablet, Take 1 tablet by mouth daily., Disp: , Rfl:  .  naproxen (NAPROSYN) 500 MG tablet, Take 1 tablet (500 mg total) by mouth 2 (two) times daily as needed., Disp: 60 tablet, Rfl: 0  Allergies  Allergen Reactions  . Penicillins Hives    I personally reviewed active problem list, medication list, allergies, family history, social history with the patient/caregiver  today.   ROS  Ten systems reviewed and is negative except as mentioned in HPI   Objective  Virtual encounter, vitals obtained at home and bp is at goal    Physical Exam  Awake, alert and oriented   PHQ2/9: Depression screen [facility] 2/9 03/25/2019 09/25/2018 03/25/2018 09/25/2017 07/30/2017  Decreased Interest 0 0 0 0 0  Down, Depressed, Hopeless 0 0 0 0 1  PHQ - 2 Score 0 0 0 0 1  Altered sleeping 0 1 1 - 0  Tired, decreased energy 0 1 1 - 0  Change in appetite 0 0 0 -  0  Feeling bad or failure about yourself  0 0 0 - 1  Trouble concentrating 0 0 0 - 0  Moving slowly or fidgety/restless 0 1 - - 0  Suicidal thoughts 0 0 0 - 0  PHQ-9 Score 0 3 2 - 2  Difficult doing work/chores Not difficult at all Not difficult at all - - Not difficult at all   PHQ-2/9 Result is negative.    Fall Risk: Fall Risk  03/25/2019 09/25/2018 10/11/2017 09/25/2017 07/30/2017  Falls in the past year? 0 0 No Yes No  Number falls in past yr: 0 0 - 1 -  Injury with Fall? 0 0 - Yes -  Risk Factor Category  - - - High Fall Risk -  Risk for fall due to : - - - Impaired balance/gait -     Assessment & Plan  1. Chronic pain of right knee  - diclofenac sodium (VOLTAREN) 1 % GEL; Apply 4 g topically 4 (four) times daily.  Dispense: 300 g; Refill: 2 - naproxen (NAPROSYN) 500 MG tablet; Take 1 tablet (500 mg total) by mouth 2 (two) times daily as needed.  Dispense: 60 tablet; Refill: 0 - baclofen (LIORESAL) 10 MG tablet; Take 1 tablet (10 mg total) by mouth 3 (three) times daily as needed for muscle spasms.  Dispense: 90 tablet; Refill: 1  2. Muscle wasting and atrophy, not elsewhere classified, right thigh  - diclofenac sodium (VOLTAREN) 1 % GEL; Apply 4 g topically 4 (four) times daily.  Dispense: 300 g; Refill: 2 - baclofen (LIORESAL) 10 MG tablet; Take 1 tablet (10 mg total) by mouth 3 (three) times daily as needed for muscle spasms.  Dispense: 90 tablet; Refill: 1  3. Spasm  - diclofenac sodium  (VOLTAREN) 1 % GEL; Apply 4 g topically 4 (four) times daily.  Dispense: 300 g; Refill: 2 - baclofen (LIORESAL) 10 MG tablet; Take 1 tablet (10 mg total) by mouth 3 (three) times daily as needed for muscle spasms.  Dispense: 90 tablet; Refill: 1  I discussed the assessment and treatment plan with the patient. The patient was provided an opportunity to ask questions and all were answered. The patient agreed with the plan and demonstrated an understanding of the instructions.  The patient was advised to call back or seek an in-person evaluation if the symptoms worsen or if the condition fails to improve as anticipated.  I provided 25  minutes of non-face-to-face time during this encounter.

## 2019-06-09 ENCOUNTER — Encounter: Payer: PPO | Admitting: Family Medicine

## 2019-06-12 ENCOUNTER — Encounter: Payer: Self-pay | Admitting: Family Medicine

## 2019-06-12 ENCOUNTER — Ambulatory Visit (INDEPENDENT_AMBULATORY_CARE_PROVIDER_SITE_OTHER): Payer: PPO | Admitting: Family Medicine

## 2019-06-12 ENCOUNTER — Other Ambulatory Visit: Payer: Self-pay

## 2019-06-12 ENCOUNTER — Ambulatory Visit: Payer: PPO

## 2019-06-12 ENCOUNTER — Other Ambulatory Visit (HOSPITAL_COMMUNITY)
Admission: RE | Admit: 2019-06-12 | Discharge: 2019-06-12 | Disposition: A | Discharge status: Home / Self Care | Payer: PPO | Source: Ambulatory Visit | Attending: Family Medicine | Admitting: Family Medicine

## 2019-06-12 VITALS — BP 130/70 | HR 80 | Temp 97.5°F | Resp 16 | Ht 64.0 in | Wt 196.7 lb

## 2019-06-12 DIAGNOSIS — M25561 Pain in right knee: Secondary | ICD-10-CM

## 2019-06-12 DIAGNOSIS — Z1211 Encounter for screening for malignant neoplasm of colon: Secondary | ICD-10-CM | POA: Diagnosis not present

## 2019-06-12 DIAGNOSIS — R739 Hyperglycemia, unspecified: Secondary | ICD-10-CM

## 2019-06-12 DIAGNOSIS — N63 Unspecified lump in unspecified breast: Secondary | ICD-10-CM | POA: Diagnosis not present

## 2019-06-12 DIAGNOSIS — E785 Hyperlipidemia, unspecified: Secondary | ICD-10-CM | POA: Diagnosis not present

## 2019-06-12 DIAGNOSIS — Z124 Encounter for screening for malignant neoplasm of cervix: Secondary | ICD-10-CM | POA: Diagnosis not present

## 2019-06-12 DIAGNOSIS — Z79899 Other long term (current) drug therapy: Secondary | ICD-10-CM

## 2019-06-12 DIAGNOSIS — Z23 Encounter for immunization: Secondary | ICD-10-CM

## 2019-06-12 DIAGNOSIS — Z85828 Personal history of other malignant neoplasm of skin: Secondary | ICD-10-CM | POA: Diagnosis not present

## 2019-06-12 DIAGNOSIS — G8929 Other chronic pain: Secondary | ICD-10-CM | POA: Diagnosis not present

## 2019-06-12 DIAGNOSIS — Z1231 Encounter for screening mammogram for malignant neoplasm of breast: Secondary | ICD-10-CM | POA: Diagnosis not present

## 2019-06-12 NOTE — Progress Notes (Signed)
Name: Barbara Rivers   MRN: [id number]    DOB: [birth year]   Date:06/12/2019       Progress Note  Subjective  Chief Complaint  Chief Complaint  Patient presents with  . Annual Exam    HPI   Patient presents for annual CPE and follow up  Hyperglycemia: A1C was elevated a few years ago, she came in fasting and willing to have labs done, she denies polyphagia, polydipsia or polyuria   Dyslipidemia: we will recheck labs, last levels done over 3 years ago. Discussed healthy diet    Diet: balanced diet  Exercise: continue physical activity, swimming   USPSTF grade A and B recommendations    Office Visit from 06/12/2019 in [facility]  AUDIT-C Score  0     Hypertension: BP Readings from Last 3 Encounters:  06/12/19 130/70  03/25/19 134/77  09/25/18 136/74   Obesity: Wt Readings from Last 3 Encounters:  06/12/19 196 lb 11.2 oz (89.2 kg)  09/25/18 199 lb 14.4 oz (90.7 kg)  03/25/18 194 lb 14.4 oz (88.4 kg)   BMI Readings from Last 3 Encounters:  06/12/19 33.76 kg/m  09/25/18 34.31 kg/m  03/25/18 33.45 kg/m    Hep C Screening: up to date  STD testing and prevention (HIV/chl/gon/syphilis): N/A Intimate partner violence: negative  Sexual History/Pain during Intercourse: not sexually active recently because husband is not feeling well  Menstrual History/LMP/Abnormal Bleeding: discussed importance of follow up if postmenopausal bleeding  Incontinence Symptoms: no symptoms   Advanced Care Planning: A voluntary discussion about advance care planning including the explanation and discussion of advance directives.  Discussed health care proxy and Living will, and the patient was able to identify a health care proxy as daughter .  Patient does have a living will at present time.   Breast cancer: due for repeat, discussed diagnostic but she would like to start with screen  BRCA gene screening: N/A Cervical cancer screening: today   Osteoporosis Screening:  repeat at age 21   Lipids:  Lab Results  Component Value Date   CHOL 216 (H) 07/19/2016   Lab Results  Component Value Date   HDL 61 07/19/2016   Lab Results  Component Value Date   LDLCALC 123 07/19/2016   Lab Results  Component Value Date   TRIG 162 (H) 07/19/2016   Lab Results  Component Value Date   CHOLHDL 3.5 07/19/2016   No results found for: LDLDIRECT  Glucose:  Glucose, Bld  Date Value Ref Range Status  07/19/2016 113 (H) 65 - 99 mg/dL Final    Skin cancer: discussed atypical , she has a history of skin cancer and needs to resume dermatologist follow up Colorectal cancer: due for repeat cologuard  Lung cancer:   Low Dose CT Chest recommended if Age 66-80 years, 30 pack-year currently smoking OR have quit w/in 15years. Patient does not qualify.   [id number]   Patient Active Problem List   Diagnosis Date Noted  . VV (varicose veins) 07/26/2016  . Venous insufficiency of right leg 07/05/2016  . Obesity (BMI 30.0-34.9) 07/05/2016  . Elevated blood pressure 07/05/2016  . Right knee pain 05/31/2016  . Primary osteoarthritis of right knee 05/31/2016  . Quadriceps tendon rupture 05/31/2016  . 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 Muscle wasting and atrophy, not elsewhere classified, right thigh 05/31/2016    Past Surgical History:  Procedure Laterality Date  . CERVICAL CONIZATION W/BX    . KNEE ARTHROSCOPY  02/23/2012   Procedure:  ARTHROSCOPY KNEE;  Surgeon: Alta Corning, MD;  Location: [place];  Service: Orthopedics;  Laterality: Right;  Lateral Retinacular Release, Chondroplasty Medial Femoral Condyle and Patella, Partial Lateral Meniscectomy  . QUADRICEPS TENDON REPAIR  11/15/2012   Procedure: REPAIR QUADRICEP TENDON;  Surgeon: Alta Corning, MD;  Location: [place];  Service: Orthopedics;  Laterality: Right;  WITH ALLOGRAFT  . TONSILLECTOMY      Family History  Problem Relation Age of Onset  . Diabetes Father   . Kidney disease  Father   . Hypertension Father   . Cancer Maternal Grandmother 64       Breast  . Breast cancer Maternal Grandmother 32  . Diabetes Paternal Grandmother     Social History   Socioeconomic History  . Marital status: Married    Spouse name: [name]  . Number of children: 1  . Years of education: Not on file  . Highest education level: Not on file  Occupational History  . Occupation: disabled    Comment: Warehouse manager comp   Social Needs  . Financial resource strain: Somewhat hard  . Food insecurity    Worry: Never true    Inability: Never true  . Transportation needs    Medical: No    Non-medical: No  Tobacco Use  . Smoking status: Never Smoker  . Smokeless tobacco: Never Used  Substance and Sexual Activity  . Alcohol use: Yes    Comment: 1 glass of wine every several months  . Drug use: No  . Sexual activity: Never  Lifestyle  . Physical activity    Days per week: 6 days    Minutes per session: 30 min  . Stress: Not at all  Relationships  . Social connections    Talks on phone: More than three times a week    Gets together: More than three times a week    Attends religious service: More than 4 times per year    Active member of club or organization: No    Attends meetings of clubs or organizations: Never    Relationship status: Married  . Intimate partner violence    Fear of current or ex partner: No    Emotionally abused: No    Physically abused: No    Forced sexual activity: No  Other Topics Concern  . Not on file  Social History Narrative   She used to work but since 2013 , had a injury at work and one year later they found out that she had quad surgery, she still has weakness , daily pain and uses a cane. Fully disabled.    Husband also had a stroke in 2015  and works from home now      Current Outpatient Medications:  .  baclofen (LIORESAL) 10 MG tablet, Take 1 tablet (10 mg total) by mouth 3 (three) times daily as needed for muscle spasms., Disp: 90  tablet, Rfl: 1 .  diclofenac sodium (VOLTAREN) 1 % GEL, Apply 4 g topically 4 (four) times daily., Disp: 300 g, Rfl: 2 .  Glucos-Chondroit-Collag-Hyal (JOINT SUPPORT FORMULA PO), Take by mouth., Disp: , Rfl:  .  Multiple Vitamin (MULTIVITAMIN) tablet, Take 1 tablet by mouth daily., Disp: , Rfl:  .  naproxen (NAPROSYN) 500 MG tablet, Take 1 tablet (500 mg total) by mouth 2 (two) times daily as needed., Disp: 60 tablet, Rfl: 0  Allergies  Allergen Reactions  . Penicillins Hives     ROS  Constitutional: Negative for  fever or weight change.  Respiratory: Negative for cough and shortness of breath.   Cardiovascular: Negative for chest pain or palpitations.  Gastrointestinal: Negative for abdominal pain, no bowel changes.  Musculoskeletal: Positive  for gait problem and intermittent  joint swelling.  Skin: Negative for rash.  Neurological: Negative for dizziness or headache.  No other specific complaints in a complete review of systems (except as listed in HPI above).  Objective  Vitals:   06/12/19 1136  BP: 130/70  Pulse: 80  Resp: 16  Temp: (!) 97.5 F (36.4 C)  TempSrc: Temporal  SpO2: 96%  Weight: 196 lb 11.2 oz (89.2 kg)  Height: '5\' 4"'  (1.626 m)    Body mass index is 33.76 kg/m.  Physical Exam  Constitutional: Patient appears well-developed and well-nourished. No distress.  HENT: Head: Normocephalic and atraumatic. Ears: B TMs ok, no erythema or effusion Eyes: Conjunctivae and EOM are normal. Pupils are equal, round, and reactive to light. No scleral icterus.  Neck: Normal range of motion. Neck supple.  No thyromegaly present.  Cardiovascular: Normal rate, regular rhythm and normal heart sounds.  No murmur heard. No BLE edema. Pulmonary/Chest: Effort normal and breath sounds normal. No respiratory distress. Abdominal: Soft. Bowel sounds are normal, no distension. There is no tenderness. no masses Breast: lumpy on left upper inner quadrant, previous abnormal [place]   FEMALE GENITALIA:  External genitalia normal External urethra normal Vaginal vault normal without discharge or lesions Cervix normal without discharge or lesions Bimanual exam normal without masses RECTAL: not done Musculoskeletal: decrease rom of right knee, no effusion, scarring apparent  Neurological: he is alert and oriented to person, place, and time. No cranial nerve deficit. Coordination, balance, strength, speech and gait are normal.  Skin: Skin is warm and dry. No rash noted. No erythema. Wart on finger Psychiatric: Patient has a normal mood and affect. behavior is normal. Judgment and thought content normal.  PHQ2/9: Depression screen [facility] 2/9 06/12/2019 03/25/2019 09/25/2018 03/25/2018 09/25/2017  Decreased Interest 0 0 0 0 0  Down, Depressed, Hopeless 0 0 0 0 0  PHQ - 2 Score 0 0 0 0 0  Altered sleeping 0 0 1 1 -  Tired, decreased energy 0 0 1 1 -  Change in appetite 0 0 0 0 -  Feeling bad or failure about yourself  0 0 0 0 -  Trouble concentrating 0 0 0 0 -  Moving slowly or fidgety/restless 0 0 1 - -  Suicidal thoughts 0 0 0 0 -  PHQ-9 Score 0 0 3 2 -  Difficult doing work/chores - Not difficult at all Not difficult at all - -    Fall Risk: Fall Risk  06/12/2019 03/25/2019 09/25/2018 10/11/2017 09/25/2017  Falls in the past year? 0 0 0 No Yes  Number falls in past yr: 0 0 0 - 1  Injury with Fall? 0 0 0 - Yes  Risk Factor Category  - - - - High Fall Risk  Risk for fall due to : - - - - Impaired balance/gait     Functional Status Survey: Is the patient deaf or have difficulty hearing?: No Does the patient have difficulty seeing, even when wearing glasses/contacts?: No Does the patient have difficulty concentrating, remembering, or making decisions?: No Does the patient have difficulty walking or climbing stairs?: Yes Does the patient have difficulty dressing or bathing?: No Does the patient have difficulty doing errands alone such as visiting a doctor's office or  shopping?: No  Assessment &  Plan  1. Dyslipidemia  - Lipid panel  2. Colon cancer screening  - Cologuard  3. Need for diphtheria-tetanus-pertussis (Tdap) vaccine  She will check coverage with insurance  4. Encounter for screening for cervical cancer   - Cytology - PAP  5. Encounter for screening mammogram for breast cancer  Diagnostic test ordered   6. Chronic pain of right knee   7. Hyperglycemia  - Hemoglobin A1c  8. Long-term use of high-risk medication  - COMPLETE METABOLIC PANEL WITH GFR - CBC with Differential/Platelet   -USPSTF grade A and B recommendations reviewed with patient; age-appropriate recommendations, preventive care, screening tests, etc discussed and encouraged; healthy living encouraged; see AVS for patient education given to patient -Discussed importance of 150 minutes of physical activity weekly, eat two servings of fish weekly, eat one serving of tree nuts ( cashews, pistachios, pecans, almonds.Marland Kitchen) every other day, eat 6 servings of fruit/vegetables daily and drink plenty of water and avoid sweet beverages.

## 2019-06-12 NOTE — Patient Instructions (Signed)

## 2019-06-13 LAB — LIPID PANEL
Cholesterol: 212 mg/dL — ABNORMAL HIGH (ref <200)
HDL: 70 mg/dL (ref >=50)
LDL Cholesterol (Calc): 121 mg/dL (calc) — ABNORMAL HIGH
Non-HDL Cholesterol (Calc): 142 mg/dL (calc) — ABNORMAL HIGH (ref <130)
Total CHOL/HDL Ratio: 3 (calc) (ref <5.0)
Triglycerides: 106 mg/dL (ref <150)

## 2019-06-13 LAB — CBC WITH DIFFERENTIAL/PLATELET
Absolute Monocytes: 581 cells/uL (ref 200–950)
Basophils Absolute: 92 cells/uL (ref 0–200)
Basophils Relative: 1.4 %
Eosinophils Absolute: 158 cells/uL (ref 15–500)
Eosinophils Relative: 2.4 %
HCT: 41.4 % (ref 35.0–45.0)
Hemoglobin: 14.4 g/dL (ref 11.7–15.5)
Lymphs Abs: 2534 cells/uL (ref 850–3900)
MCH: 31.1 pg (ref 27.0–33.0)
MCHC: 34.8 g/dL (ref 32.0–36.0)
MCV: 89.4 fL (ref 80.0–100.0)
MPV: 12.1 fL (ref 7.5–12.5)
Monocytes Relative: 8.8 %
Neutro Abs: 3234 cells/uL (ref 1500–7800)
Neutrophils Relative %: 49 %
Platelets: 337 10*3/uL (ref 140–400)
RBC: 4.63 10*6/uL (ref 3.80–5.10)
RDW: 12.1 % (ref 11.0–15.0)
Total Lymphocyte: 38.4 %
WBC: 6.6 10*3/uL (ref 3.8–10.8)

## 2019-06-13 LAB — COMPLETE METABOLIC PANEL WITH GFR
AG Ratio: 1.5 (calc) (ref 1.0–2.5)
ALT: 38 U/L — ABNORMAL HIGH (ref 6–29)
AST: 24 U/L (ref 10–35)
Albumin: 4.4 g/dL (ref 3.6–5.1)
Alkaline phosphatase (APISO): 143 U/L (ref 37–153)
BUN: 10 mg/dL (ref 7–25)
CO2: 28 mmol/L (ref 20–32)
Calcium: 9.5 mg/dL (ref 8.6–10.4)
Chloride: 103 mmol/L (ref 98–110)
Creat: 0.66 mg/dL (ref 0.50–0.99)
GFR, Est African American: 110 mL/min/{1.73_m2} (ref >=60)
GFR, Est Non African American: 95 mL/min/{1.73_m2} (ref >=60)
Globulin: 3 g/dL (calc) (ref 1.9–3.7)
Glucose, Bld: 114 mg/dL — ABNORMAL HIGH (ref 65–99)
Potassium: 4.2 mmol/L (ref 3.5–5.3)
Sodium: 139 mmol/L (ref 135–146)
Total Bilirubin: 0.5 mg/dL (ref 0.2–1.2)
Total Protein: 7.4 g/dL (ref 6.1–8.1)

## 2019-06-13 LAB — HEMOGLOBIN A1C
Hgb A1c MFr Bld: 6.7 % of total Hgb — ABNORMAL HIGH (ref <5.7)
Mean Plasma Glucose: 146 (calc)
eAG (mmol/L): 8.1 (calc)

## 2019-06-16 LAB — CYTOLOGY - PAP: Diagnosis: NEGATIVE

## 2019-06-17 ENCOUNTER — Ambulatory Visit (INDEPENDENT_AMBULATORY_CARE_PROVIDER_SITE_OTHER): Payer: PPO

## 2019-06-17 VITALS — BP 122/76 | Ht 64.0 in | Wt 194.2 lb

## 2019-06-17 DIAGNOSIS — Z0000 Encounter for general adult medical examination without abnormal findings: Secondary | ICD-10-CM | POA: Diagnosis not present

## 2019-06-17 NOTE — Progress Notes (Addendum)
Subjective:   Barbara Rivers is a 63 y.o. female who presents for Medicare Annual (Subsequent) preventive examination.  Virtual Visit via Telephone Note  I connected with Barbara Rivers on 06/17/19 at  3:30 PM EDT by telephone and verified that I am speaking with the correct person using two identifiers.  Medicare Annual Wellness visit completed telephonically due to Covid-19 pandemic.  Location: Patient: home Provider: office   I discussed the limitations, risks, security and privacy concerns of performing an evaluation and management service by telephone and the availability of in person appointments. The patient expressed understanding and agreed to proceed.  Some vital signs may be absent or patient reported.   Barbara Marker, LPN   Review of Systems:   Cardiac Risk Factors include: obesity (BMI >30kg/m2)     Objective:     Vitals: BP 122/76   Ht 5\' 4"  (1.626 m)   Wt 194 lb 3.2 oz (88.1 kg)   BMI 33.33 kg/m   Body mass index is 33.33 kg/m.  Advanced Directives 06/17/2019 07/30/2017 09/18/2016 07/05/2016 11/12/2012 02/20/2012  Does Patient Have a Medical Advance Directive? Yes No No No Patient has advance directive, copy not in chart Patient does not have advance directive  Type of Advance Directive [place];Living will - - - Living will;Healthcare Power of Attorney -  Copy of [place] in Chart? No - copy requested - - - - -  Would patient like information on creating a medical advance directive? - No - Patient declined No - patient declined information No - patient declined information - -    Tobacco Social History   Tobacco Use  Smoking Status Never Smoker  Smokeless Tobacco Never Used     Counseling given: Not Answered   Clinical Intake:  Pre-visit preparation completed: Yes  Pain : 0-10 Pain Score: 5  Pain Type: Chronic pain Pain Location: Knee Pain Orientation: Right Pain Descriptors / Indicators: Aching,  Discomfort Pain Onset: More than a month ago Pain Frequency: Constant     BMI - recorded: 33.33 Nutritional Status: BMI > 30  Obese Nutritional Risks: None Diabetes: No  How often do you need to have someone help you when you read instructions, pamphlets, or other written materials from your doctor or pharmacy?: 1 - Never  Interpreter Needed?: No  Information entered by :: Barbara Marker LPN  Past Medical History:  Diagnosis Date  . Arthritis    right knee  . Disorder of jaw    occ. becomes tight and locks with dental procedures  . Immature cataract    left eye  . Quadriceps tendon rupture 11/2012   right   Past Surgical History:  Procedure Laterality Date  . CERVICAL CONIZATION W/BX    . KNEE ARTHROSCOPY  02/23/2012   Procedure: ARTHROSCOPY KNEE;  Surgeon: Alta Corning, MD;  Location: [place];  Service: Orthopedics;  Laterality: Right;  Lateral Retinacular Release, Chondroplasty Medial Femoral Condyle and Patella, Partial Lateral Meniscectomy  . QUADRICEPS TENDON REPAIR  11/15/2012   Procedure: REPAIR QUADRICEP TENDON;  Surgeon: Alta Corning, MD;  Location: [place];  Service: Orthopedics;  Laterality: Right;  WITH ALLOGRAFT  . TONSILLECTOMY     Family History  Problem Relation Age of Onset  . Diabetes Father   . Kidney disease Father   . Hypertension Father   . Cancer Maternal Grandmother 68       Breast  . Breast cancer Maternal Grandmother  24  . Diabetes Paternal Grandmother    Social History   Socioeconomic History  . Marital status: Married    Spouse name: Barbara Rivers  . Number of children: 1  . Years of education: Not on file  . Highest education level: Not on file  Occupational History  . Occupation: disabled    Comment: Warehouse manager comp   Social Needs  . Financial resource strain: Somewhat hard  . Food insecurity    Worry: Never true    Inability: Never true  . Transportation needs    Medical: No    Non-medical: No   Tobacco Use  . Smoking status: Never Smoker  . Smokeless tobacco: Never Used  Substance and Sexual Activity  . Alcohol use: Yes    Comment: 1 glass of wine every several months  . Drug use: No  . Sexual activity: Never  Lifestyle  . Physical activity    Days per week: 5 days    Minutes per session: 30 min  . Stress: Only a little  Relationships  . Social connections    Talks on phone: More than three times a week    Gets together: More than three times a week    Attends religious service: More than 4 times per year    Active member of club or organization: No    Attends meetings of clubs or organizations: Never    Relationship status: Married  Other Topics Concern  . Not on file  Social History Narrative   She used to work but since 2013 , had a injury at work and one year later they found out that she had quad surgery, she still has weakness , daily pain and uses a cane. Fully disabled.    Husband also had a stroke in 2015  and works from home now     Outpatient Encounter Medications as of 06/17/2019  Medication Sig  . baclofen (LIORESAL) 10 MG tablet Take 1 tablet (10 mg total) by mouth 3 (three) times daily as needed for muscle spasms.  . diclofenac sodium (VOLTAREN) 1 % GEL Apply 4 g topically 4 (four) times daily.  . Glucos-Chondroit-Collag-Hyal (JOINT SUPPORT FORMULA PO) Take by mouth.  Marland Kitchen ibuprofen (ADVIL) 200 MG tablet Take 200 mg by mouth every 6 (six) hours as needed. Pt takes 2 tabs PRN pain  . Multiple Vitamin (MULTIVITAMIN) tablet Take 1 tablet by mouth daily.  . [DISCONTINUED] naproxen (NAPROSYN) 500 MG tablet Take 1 tablet (500 mg total) by mouth 2 (two) times daily as needed.   No facility-administered encounter medications on file as of 06/17/2019.     Activities of Daily Living In your present state of health, do you have any difficulty performing the following activities: 06/17/2019 06/12/2019  Hearing? N N  Comment declines hearing aids -  Vision? N N   Comment - -  Difficulty concentrating or making decisions? N N  Walking or climbing stairs? N Y  Dressing or bathing? N N  Doing errands, shopping? N N  Preparing Food and eating ? N -  Using the Toilet? N -  In the past six months, have you accidently leaked urine? N -  Do you have problems with loss of bowel control? N -  Managing your Medications? N -  Managing your Finances? N -  Housekeeping or managing your Housekeeping? N -  Some recent data might be hidden    Patient Care Team: Steele Sizer, MD as PCP - General (Family Medicine) Leandrew Koyanagi, MD  as Referring Physician (Ophthalmology)    Assessment:   This is a routine wellness examination for [place].  Exercise Activities and Dietary recommendations Current Exercise Habits: Home exercise routine, Type of exercise: Other - see comments(bike stepper, swimming), Time (Minutes): 30, Frequency (Times/Week): 5, Weekly Exercise (Minutes/Week): 150, Intensity: Mild  Goals    . Weight (lb) < 170 lb (77.1 kg)     Recommend cutting back on portion sizes to aid in loosing 20 lbs.        Fall Risk Fall Risk  06/17/2019 06/12/2019 03/25/2019 09/25/2018 10/11/2017  Falls in the past year? 0 0 0 0 No  Number falls in past yr: 0 0 0 0 -  Injury with Fall? 0 0 0 0 -  Risk Factor Category  - - - - -  Risk for fall due to : - - - - -  Follow up Falls prevention discussed - - - -   FALL RISK PREVENTION PERTAINING TO THE HOME:  Any stairs in or around the home? Yes  If so, do they handrails? Yes   Home free of loose throw rugs in walkways, pet beds, electrical cords, etc? Yes  Adequate lighting in your home to reduce risk of falls? Yes   ASSISTIVE DEVICES UTILIZED TO PREVENT FALLS:  Life alert? No  Use of a cane, walker or w/c? Yes  Grab bars in the bathroom? No  Shower chair or bench in shower? Yes  Elevated toilet seat or a handicapped toilet? Yes   DME ORDERS:  DME order needed?  No   TIMED UP AND GO:  Was the  test performed? No .  Telephonic visit.   Education: Fall risk prevention has been discussed.  Intervention(s) required? No    Depression Screen PHQ 2/9 Scores 06/17/2019 06/12/2019 03/25/2019 09/25/2018  PHQ - 2 Score 0 0 0 0  PHQ- 9 Score - 0 0 3     Cognitive Function pt declined 6CIT        Immunization History  Administered Date(s) Administered  . Influenza,inj,Quad PF,6+ Mos 09/18/2016, 09/25/2017, 09/11/2018  . Zoster Recombinat (Shingrix) 03/25/2018, 08/22/2018    Qualifies for Shingles Vaccine? Yes  Shingrix series completed.   Tdap: Although this vaccine is not a covered service during a Wellness Exam, does the patient still wish to receive this vaccine today?  No .  Education has been provided regarding the importance of this vaccine. Advised may receive this vaccine at local pharmacy or Health Dept. Aware to provide a copy of the vaccination record if obtained from local pharmacy or Health Dept. Verbalized acceptance and understanding.  Flu Vaccine: Up to date  Pneumococcal Vaccine: Due for Pneumococcal vaccine at age 64  Screening Tests Health Maintenance  Topic Date Due  . TETANUS/TDAP  04/14/1976  . DEXA SCAN  10/11/2018  . INFLUENZA VACCINE  06/07/2019  . HIV Screening  08/16/2029 (Originally 04[birth year]14/1972)  . Fecal DNA (Cologuard)  08/04/2019  . MAMMOGRAM  11/21/2019  . PAP SMEAR-Modifier  06/11/2022  . Hepatitis C Screening  Completed   Cancer Screenings:  Colorectal Screening: Cologuard Completed 08/03/16. Repeat every 3 years. Ordered 06/12/19.  Mammogram: Completed 11/20/17. Repeat every year. Ordered 06/12/19. Pt provided with contact information and advised to call to schedule appt.   Bone Density: Completed 10/11/16. Results reflect NORMAL. Repeat at age 37.  Lung Cancer Screening: (Low Dose CT Chest recommended if Age 13-80 years, 30 pack-year currently smoking OR have quit w/in 15years.) does not qualify.  Additional Screening:  Hepatitis C  Screening: does qualify; Completed 07/20/16  Vision Screening: Recommended annual ophthalmology exams for early detection of glaucoma and other disorders of the eye. Is the patient up to date with their annual eye exam?  Yes  Who is the provider or what is the name of the office in which the pt attends annual eye exams? [place] Screening: Recommended annual dental exams for proper oral hygiene  Community Resource Referral:  CRR required this visit?  No      Plan:    I have personally reviewed and addressed the Medicare Annual Wellness questionnaire and have noted the following in the patient's chart:  A. Medical and social history B. Use of alcohol, tobacco or illicit drugs  C. Current medications and supplements D. Functional ability and status E.  Nutritional status F.  Physical activity G. Advance directives H. List of other physicians I.  Hospitalizations, surgeries, and ER visits in previous 12 months J.  [place] such as hearing and vision if needed, cognitive and depression L. Referrals and appointments   In addition, I have reviewed and discussed with patient certain preventive protocols, quality metrics, and best practice recommendations. A written personalized care plan for preventive services as well as general preventive health recommendations were provided to patient.   Signed,  Barbara Marker, LPN Nurse Health Advisor   Nurse Notes: none

## 2019-06-17 NOTE — Patient Instructions (Addendum)
Barbara Rivers , Thank you for taking time to come for your Medicare Wellness Visit. I appreciate your ongoing commitment to your health goals. Please review the following plan we discussed and let me know if I can assist you in the future.   Screening recommendations/referrals: Colonoscopy: Cologuard completed 08/03/16. Ordered 06/12/19. Complete in October.  Mammogram: done 11/20/17. Please call [phone] to schedule your mammogram.  Bone Density: done 10/11/16 Recommended yearly ophthalmology/optometry visit for glaucoma screening and checkup Recommended yearly dental visit for hygiene and checkup  Vaccinations: Influenza vaccine: done 09/11/18 Pneumococcal vaccine: due age 49 Tdap vaccine: due - please contact us if you get a cut or scrape Shingles vaccine: Shingrix series completed 08/22/18.  Advanced directives: Please bring a copy of your health care power of attorney and living will to the office at your convenience.  Conditions/risks identified: Keep up the great work!  Next appointment: Please follow up in one year for your Medicare Annual Wellness visit.    Preventive Care 40-64 Years, Female Preventive care refers to lifestyle choices and visits with your health care provider that can promote health and wellness. What does preventive care include?  A yearly physical exam. This is also called an annual well check.  Dental exams once or twice a year.  Routine eye exams. Ask your health care provider how often you should have your eyes checked.  Personal lifestyle choices, including:  Daily care of your teeth and gums.  Regular physical activity.  Eating a healthy diet.  Avoiding tobacco and drug use.  Limiting alcohol use.  Practicing safe sex.  Taking low-dose aspirin daily starting at age 47.  Taking vitamin and mineral supplements as recommended by your health care provider. What happens during an annual well check? The services and screenings done by your  health care provider during your annual well check will depend on your age, overall health, lifestyle risk factors, and family history of disease. Counseling  Your health care provider may ask you questions about your:  Alcohol use.  Tobacco use.  Drug use.  Emotional well-being.  Home and relationship well-being.  Sexual activity.  Eating habits.  Work and work Statistician.  Method of birth control.  Menstrual cycle.  Pregnancy history. Screening  You may have the following tests or measurements:  Height, weight, and BMI.  Blood pressure.  Lipid and cholesterol levels. These may be checked every 5 years, or more frequently if you are over 41 years old.  Skin check.  Lung cancer screening. You may have this screening every year starting at age 36 if you have a 30-pack-year history of smoking and currently smoke or have quit within the past 15 years.  Fecal occult blood test (FOBT) of the stool. You may have this test every year starting at age 24.  Flexible sigmoidoscopy or colonoscopy. You may have a sigmoidoscopy every 5 years or a colonoscopy every 10 years starting at age 54.  Hepatitis C blood test.  Hepatitis B blood test.  Sexually transmitted disease (STD) testing.  Diabetes screening. This is done by checking your blood sugar (glucose) after you have not eaten for a while (fasting). You may have this done every 1-3 years.  Mammogram. This may be done every 1-2 years. Talk to your health care provider about when you should start having regular mammograms. This may depend on whether you have a family history of breast cancer.  BRCA-related cancer screening. This may be done if you have a family history of breast,  ovarian, tubal, or peritoneal cancers.  Pelvic exam and Pap test. This may be done every 3 years starting at age 61. Starting at age 32, this may be done every 5 years if you have a Pap test in combination with an HPV test.  Bone density scan.  This is done to screen for osteoporosis. You may have this scan if you are at high risk for osteoporosis. Discuss your test results, treatment options, and if necessary, the need for more tests with your health care provider. Vaccines  Your health care provider may recommend certain vaccines, such as:  Influenza vaccine. This is recommended every year.  Tetanus, diphtheria, and acellular pertussis (Tdap, Td) vaccine. You may need a Td booster every 10 years.  Zoster vaccine. You may need this after age 49.  Pneumococcal 13-valent conjugate (PCV13) vaccine. You may need this if you have certain conditions and were not previously vaccinated.  Pneumococcal polysaccharide (PPSV23) vaccine. You may need one or two doses if you smoke cigarettes or if you have certain conditions. Talk to your health care provider about which screenings and vaccines you need and how often you need them. This information is not intended to replace advice given to you by your health care provider. Make sure you discuss any questions you have with your health care provider. Document Released: 11/19/2015 Document Revised: 07/12/2016 Document Reviewed: 08/24/2015 Elsevier Interactive Patient Education  2017 [employer] Prevention in the Home Falls can cause injuries. They can happen to people of all ages. There are many things you can do to make your home safe and to help prevent falls. What can I do on the outside of my home?  Regularly fix the edges of walkways and driveways and fix any cracks.  Remove anything that might make you trip as you walk through a door, such as a raised step or threshold.  Trim any bushes or trees on the path to your home.  Use bright outdoor lighting.  Clear any walking paths of anything that might make someone trip, such as rocks or tools.  Regularly check to see if handrails are loose or broken. Make sure that both sides of any steps have handrails.  Any raised decks  and porches should have guardrails on the edges.  Have any leaves, snow, or ice cleared regularly.  Use sand or salt on walking paths during winter.  Clean up any spills in your garage right away. This includes oil or grease spills. What can I do in the bathroom?  Use night lights.  Install grab bars by the toilet and in the tub and shower. Do not use towel bars as grab bars.  Use non-skid mats or decals in the tub or shower.  If you need to sit down in the shower, use a plastic, non-slip stool.  Keep the floor dry. Clean up any water that spills on the floor as soon as it happens.  Remove soap buildup in the tub or shower regularly.  Attach bath mats securely with double-sided non-slip rug tape.  Do not have throw rugs and other things on the floor that can make you trip. What can I do in the bedroom?  Use night lights.  Make sure that you have a light by your bed that is easy to reach.  Do not use any sheets or blankets that are too big for your bed. They should not hang down onto the floor.  Have a firm chair that has side  arms. You can use this for support while you get dressed.  Do not have throw rugs and other things on the floor that can make you trip. What can I do in the kitchen?  Clean up any spills right away.  Avoid walking on wet floors.  Keep items that you use a lot in easy-to-reach places.  If you need to reach something above you, use a strong step stool that has a grab bar.  Keep electrical cords out of the way.  Do not use floor polish or wax that makes floors slippery. If you must use wax, use non-skid floor wax.  Do not have throw rugs and other things on the floor that can make you trip. What can I do with my stairs?  Do not leave any items on the stairs.  Make sure that there are handrails on both sides of the stairs and use them. Fix handrails that are broken or loose. Make sure that handrails are as long as the stairways.  Check any  carpeting to make sure that it is firmly attached to the stairs. Fix any carpet that is loose or worn.  Avoid having throw rugs at the top or bottom of the stairs. If you do have throw rugs, attach them to the floor with carpet tape.  Make sure that you have a light switch at the top of the stairs and the bottom of the stairs. If you do not have them, ask someone to add them for you. What else can I do to help prevent falls?  Wear shoes that:  Do not have high heels.  Have rubber bottoms.  Are comfortable and fit you well.  Are closed at the toe. Do not wear sandals.  If you use a stepladder:  Make sure that it is fully opened. Do not climb a closed stepladder.  Make sure that both sides of the stepladder are locked into place.  Ask someone to hold it for you, if possible.  Clearly mark and make sure that you can see:  Any grab bars or handrails.  First and last steps.  Where the edge of each step is.  Use tools that help you move around (mobility aids) if they are needed. These include:  Canes.  Walkers.  Scooters.  Crutches.  Turn on the lights when you go into a dark area. Replace any light bulbs as soon as they burn out.  Set up your furniture so you have a clear path. Avoid moving your furniture around.  If any of your floors are uneven, fix them.  If there are any pets around you, be aware of where they are.  Review your medicines with your doctor. Some medicines can make you feel dizzy. This can increase your chance of falling. Ask your doctor what other things that you can do to help prevent falls. This information is not intended to replace advice given to you by your health care provider. Make sure you discuss any questions you have with your health care provider. Document Released: 08/19/2009 Document Revised: 03/30/2016 Document Reviewed: 11/27/2014 Elsevier Interactive Patient Education  2017 [employer].

## 2019-08-07 ENCOUNTER — Ambulatory Visit
Admission: RE | Admit: 2019-08-07 | Discharge: 2019-08-07 | Disposition: A | Discharge status: Home / Self Care | Payer: PPO | Source: Ambulatory Visit | Attending: Family Medicine | Admitting: Family Medicine

## 2019-08-07 DIAGNOSIS — Z1231 Encounter for screening mammogram for malignant neoplasm of breast: Secondary | ICD-10-CM | POA: Diagnosis not present

## 2019-08-20 DIAGNOSIS — Z1211 Encounter for screening for malignant neoplasm of colon: Secondary | ICD-10-CM | POA: Diagnosis not present

## 2019-08-27 LAB — COLOGUARD: Cologuard: NEGATIVE

## 2019-09-16 ENCOUNTER — Ambulatory Visit (INDEPENDENT_AMBULATORY_CARE_PROVIDER_SITE_OTHER): Payer: PPO

## 2019-09-16 ENCOUNTER — Encounter: Payer: Self-pay | Admitting: Family Medicine

## 2019-09-16 ENCOUNTER — Ambulatory Visit (INDEPENDENT_AMBULATORY_CARE_PROVIDER_SITE_OTHER): Payer: Self-pay | Admitting: Family Medicine

## 2019-09-16 ENCOUNTER — Other Ambulatory Visit: Payer: Self-pay

## 2019-09-16 VITALS — BP 134/82 | HR 88 | Temp 97.3°F | Resp 16 | Ht 64.0 in | Wt 194.4 lb

## 2019-09-16 DIAGNOSIS — R252 Cramp and spasm: Secondary | ICD-10-CM

## 2019-09-16 DIAGNOSIS — G8929 Other chronic pain: Secondary | ICD-10-CM

## 2019-09-16 DIAGNOSIS — M25561 Pain in right knee: Secondary | ICD-10-CM

## 2019-09-16 DIAGNOSIS — Z23 Encounter for immunization: Secondary | ICD-10-CM

## 2019-09-16 DIAGNOSIS — M62551 Muscle wasting and atrophy, not elsewhere classified, right thigh: Secondary | ICD-10-CM

## 2019-09-16 MED ORDER — BACLOFEN 10 MG PO TABS: 10.0000 mg | ORAL_TABLET | Freq: Three times a day (TID) | ORAL | 1 refills | Status: DC | PRN

## 2019-09-16 MED ORDER — MELOXICAM 7.5 MG PO TABS: 7.5000 mg | ORAL_TABLET | Freq: Two times a day (BID) | ORAL | 1 refills | Status: DC

## 2019-09-16 NOTE — Progress Notes (Signed)
Name: Barbara Rivers   MRN: [id number]    DOB: 1956-08-22   Date:09/16/2019       Progress Note  Subjective  Chief Complaint  Chief Complaint  Patient presents with  . Work related injury/chronic right knee pain/CRPS    HPI  Work related injury/chronic right knee pain/CRPS: she is still having daily pain on right knee, decrease rom , uses a cane for ambulation and when groceries shopping has to use the store cart for support, she was  off oral nsaid's and Lyrica, however since unable to have massage she has resumed oral nsaids to control swelling and pain. .She has atrophy of thigh and right leg weakness.Unable to work secondary to pain and has social security disability. Injury happened 07/2011, settlement 2018. Surgery was in 2013 and quadriceps tendon repair 2014 after a rupture. She states topical medication helps with pain, . Shestates Baclofen helps with muscle spasm of her right quad.She is using [employer] at [employer] feels like it is helping with her hamstring strength, but unable to use for more than 10 minutes each time  if knee is swollen.She states water activity helps a lot with her symptoms but her pool water is too cold for her at this time. Since COVID-19 she was not able to have massage therapy every two weeks and noticed significant increase in swelling and pain, she states when symptoms gets worse she feels like the right quad goes numb. She also states husband has been sick and she had to take care of the dogs - therefore having to use steps to get out of the house more often and that also aggravated her right knee. She states right now pain is 6/10, stable. She is back seeing her massage therapist over the past couple of months and that has helped.    Patient Active Problem List   Diagnosis Date Noted  . VV (varicose veins) 07/26/2016  . Venous insufficiency of right leg 07/05/2016  . Obesity (BMI 30.0-34.9) 07/05/2016  . Elevated blood pressure 07/05/2016  . Right  knee pain 05/31/2016  . Primary osteoarthritis of right knee 05/31/2016  . Quadriceps tendon rupture 05/31/2016  . 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 Muscle wasting and atrophy, not elsewhere classified, right thigh 05/31/2016    Past Surgical History:  Procedure Laterality Date  . CERVICAL CONIZATION W/BX    . KNEE ARTHROSCOPY  02/23/2012   Procedure: ARTHROSCOPY KNEE;  Surgeon: Alta Corning, MD;  Location: [place];  Service: Orthopedics;  Laterality: Right;  Lateral Retinacular Release, Chondroplasty Medial Femoral Condyle and Patella, Partial Lateral Meniscectomy  . QUADRICEPS TENDON REPAIR  11/15/2012   Procedure: REPAIR QUADRICEP TENDON;  Surgeon: Alta Corning, MD;  Location: [place];  Service: Orthopedics;  Laterality: Right;  WITH ALLOGRAFT  . TONSILLECTOMY      Family History  Problem Relation Age of Onset  . Diabetes Father   . Kidney disease Father   . Hypertension Father   . Cancer Maternal Grandmother 42       Breast  . Breast cancer Maternal Grandmother 80  . Diabetes Paternal Grandmother     Social History   Socioeconomic History  . Marital status: Married    Spouse name: [name]  . Number of children: 1  . Years of education: Not on file  . Highest education level: Not on file  Occupational History  . Occupation: disabled    Comment: Warehouse manager comp   Social Needs  . Financial  resource strain: Somewhat hard  . Food insecurity    Worry: Never true    Inability: Never true  . Transportation needs    Medical: No    Non-medical: No  Tobacco Use  . Smoking status: Never Smoker  . Smokeless tobacco: Never Used  Substance and Sexual Activity  . Alcohol use: Yes    Comment: 1 glass of wine every several months  . Drug use: No  . Sexual activity: Never  Lifestyle  . Physical activity    Days per week: 5 days    Minutes per session: 30 min  . Stress: Only a little  Relationships  . Social connections    Talks on phone: More  than three times a week    Gets together: More than three times a week    Attends religious service: More than 4 times per year    Active member of club or organization: No    Attends meetings of clubs or organizations: Never    Relationship status: Married  . Intimate partner violence    Fear of current or ex partner: No    Emotionally abused: No    Physically abused: No    Forced sexual activity: No  Other Topics Concern  . Not on file  Social History Narrative   She used to work but since 2013 , had a injury at work and one year later they found out that she had quad surgery, she still has weakness , daily pain and uses a cane. Fully disabled.    Husband also had a stroke in 2015  and works from home now      Current Outpatient Medications:  .  baclofen (LIORESAL) 10 MG tablet, Take 1 tablet (10 mg total) by mouth 3 (three) times daily as needed for muscle spasms., Disp: 90 tablet, Rfl: 1 .  diclofenac sodium (VOLTAREN) 1 % GEL, Apply 4 g topically 4 (four) times daily., Disp: 300 g, Rfl: 2 .  Glucos-Chondroit-Collag-Hyal (JOINT SUPPORT FORMULA PO), Take by mouth., Disp: , Rfl:  .  ibuprofen (ADVIL) 200 MG tablet, Take 200 mg by mouth every 6 (six) hours as needed. Pt takes 2 tabs PRN pain, Disp: , Rfl:  .  Multiple Vitamin (MULTIVITAMIN) tablet, Take 1 tablet by mouth daily., Disp: , Rfl:  .  Multiple Vitamins-Minerals (EMERGEN-C IMMUNE PLUS/VIT D PO), Take 2 each by mouth daily. Gummies and sometimes the Liquid, Disp: , Rfl:   Allergies  Allergen Reactions  . Penicillins Hives    I personally reviewed active problem list, medication list, allergies, family history, social history, health maintenance with the patient/caregiver today.   ROS  Constitutional: Negative for fever or weight change.  Respiratory: Negative for cough and shortness of breath.   Cardiovascular: Negative for chest pain or palpitations.  Gastrointestinal: Negative for abdominal pain, no bowel changes.   Musculoskeletal: Negative for gait problem or joint swelling.  Skin: Negative for rash.  Neurological: Negative for dizziness or headache.  No other specific complaints in a complete review of systems (except as listed in HPI above).  Objective  Vitals:   09/16/19 1131  BP: 134/82  Pulse: 88  Resp: 16  Temp: (!) 97.3 F (36.3 C)  TempSrc: Temporal  SpO2: 98%  Weight: 194 lb 6.4 oz (88.2 kg)  Height: 5\' 4"  (1.626 m)    Body mass index is 33.37 kg/m.  Physical Exam  Constitutional: Patient appears well-developed and well-nourished. Obese  No distress.  HEENT: head atraumatic, normocephalic,  pupils equal and reactive to light Cardiovascular: Normal rate, regular rhythm and normal heart sounds.  No murmur heard. No BLE edema. Pulmonary/Chest: Effort normal and breath sounds normal. No respiratory distress. Abdominal: Soft.  There is no tenderness. Muscular Skeletal: scar well healed on right knee, some effusion, quad atrophy medially still present, decrease rom  Psychiatric: Patient has a normal mood and affect. behavior is normal. Judgment and thought content normal.  Recent Results (from the past 2160 hour(s))  Cologuard     Status: None   Collection Time: 08/20/19 12:00 AM  Result Value Ref Range   Cologuard Negative Negative     PHQ2/9: Depression screen [facility] 2/9 09/16/2019 06/17/2019 06/12/2019 03/25/2019 09/25/2018  Decreased Interest 0 0 0 0 0  Down, Depressed, Hopeless 0 0 0 0 0  PHQ - 2 Score 0 0 0 0 0  Altered sleeping 1 - 0 0 1  Tired, decreased energy 1 - 0 0 1  Change in appetite 0 - 0 0 0  Feeling bad or failure about yourself  1 - 0 0 0  Trouble concentrating 0 - 0 0 0  Moving slowly or fidgety/restless 1 - 0 0 1  Suicidal thoughts 0 - 0 0 0  PHQ-9 Score 4 - 0 0 3  Difficult doing work/chores Not difficult at all - - Not difficult at all Not difficult at all    phq 9 is negative   Fall Risk: Fall Risk  09/16/2019 06/17/2019 06/12/2019 03/25/2019  09/25/2018  Falls in the past year? 0 0 0 0 0  Number falls in past yr: 0 0 0 0 0  Injury with Fall? 0 0 0 0 0  Risk Factor Category  - - - - -  Risk for fall due to : - - - - -  Follow up - Falls prevention discussed - - -     Functional Status Survey: Is the patient deaf or have difficulty hearing?: No Does the patient have difficulty seeing, even when wearing glasses/contacts?: No Does the patient have difficulty concentrating, remembering, or making decisions?: No Does the patient have difficulty walking or climbing stairs?: Yes Does the patient have difficulty dressing or bathing?: No Does the patient have difficulty doing errands alone such as visiting a doctor's office or shopping?: No    Assessment & Plan  1. Chronic pain of right knee  - baclofen (LIORESAL) 10 MG tablet; Take 1 tablet (10 mg total) by mouth 3 (three) times daily as needed for muscle spasms.  Dispense: 90 tablet; Refill: 1 - meloxicam (MOBIC) 7.5 MG tablet; Take 1 tablet (7.5 mg total) by mouth 2 (two) times daily.  Dispense: 180 tablet; Refill: 1  2. Muscle wasting and atrophy, not elsewhere classified, right thigh  - baclofen (LIORESAL) 10 MG tablet; Take 1 tablet (10 mg total) by mouth 3 (three) times daily as needed for muscle spasms.  Dispense: 90 tablet; Refill: 1  3. Spasm  - baclofen (LIORESAL) 10 MG tablet; Take 1 tablet (10 mg total) by mouth 3 (three) times daily as needed for muscle spasms.  Dispense: 90 tablet; Refill: 1

## 2019-12-12 DIAGNOSIS — D225 Melanocytic nevi of trunk: Secondary | ICD-10-CM | POA: Diagnosis not present

## 2019-12-12 DIAGNOSIS — L538 Other specified erythematous conditions: Secondary | ICD-10-CM | POA: Diagnosis not present

## 2019-12-12 DIAGNOSIS — D2261 Melanocytic nevi of right upper limb, including shoulder: Secondary | ICD-10-CM | POA: Diagnosis not present

## 2019-12-12 DIAGNOSIS — D2271 Melanocytic nevi of right lower limb, including hip: Secondary | ICD-10-CM | POA: Diagnosis not present

## 2019-12-12 DIAGNOSIS — R208 Other disturbances of skin sensation: Secondary | ICD-10-CM | POA: Diagnosis not present

## 2019-12-12 DIAGNOSIS — L821 Other seborrheic keratosis: Secondary | ICD-10-CM | POA: Diagnosis not present

## 2019-12-12 DIAGNOSIS — D485 Neoplasm of uncertain behavior of skin: Secondary | ICD-10-CM | POA: Diagnosis not present

## 2019-12-12 DIAGNOSIS — D2272 Melanocytic nevi of left lower limb, including hip: Secondary | ICD-10-CM | POA: Diagnosis not present

## 2019-12-12 DIAGNOSIS — B078 Other viral warts: Secondary | ICD-10-CM | POA: Diagnosis not present

## 2019-12-12 DIAGNOSIS — D2262 Melanocytic nevi of left upper limb, including shoulder: Secondary | ICD-10-CM | POA: Diagnosis not present

## 2020-02-13 ENCOUNTER — Encounter: Payer: Self-pay | Admitting: Podiatry

## 2020-02-13 ENCOUNTER — Ambulatory Visit (INDEPENDENT_AMBULATORY_CARE_PROVIDER_SITE_OTHER): Payer: PPO

## 2020-02-13 ENCOUNTER — Other Ambulatory Visit: Payer: Self-pay

## 2020-02-13 ENCOUNTER — Ambulatory Visit: Payer: PPO | Admitting: Podiatry

## 2020-02-13 VITALS — Temp 97.7°F

## 2020-02-13 DIAGNOSIS — S99922A Unspecified injury of left foot, initial encounter: Secondary | ICD-10-CM

## 2020-02-13 DIAGNOSIS — M778 Other enthesopathies, not elsewhere classified: Secondary | ICD-10-CM

## 2020-02-16 NOTE — Progress Notes (Signed)
   HPI: 64 y.o. female presenting today as a new patient with a chief complaint of aching and soreness of the left arch that began about 6 weeks ago. She states a humidifier fell on top of the foot when the pain began. She reports associated bruising at the time of the incident that has since resolved. She reports continued associated swelling. Standing and walking increase the pain. She has been resting and icing the foot as well as taking Advil and using Voltaren gel for treatment. Patient is here for further evaluation and treatment.   Past Medical History:  Diagnosis Date  . Arthritis    right knee  . Disorder of jaw    occ. becomes tight and locks with dental procedures  . Immature cataract    left eye  . Quadriceps tendon rupture 11/2012   right     Physical Exam: General: The patient is alert and oriented x3 in no acute distress.  Dermatology: Skin is warm, dry and supple bilateral lower extremities. Negative for open lesions or macerations.  Vascular: Palpable pedal pulses bilaterally. No edema or erythema noted. Capillary refill within normal limits.  Neurological: Epicritic and protective threshold grossly intact bilaterally.   Musculoskeletal Exam: Pain with palpation noted to the left midfoot. Range of motion within normal limits to all pedal and ankle joints bilateral. Muscle strength 5/5 in all groups bilateral.   Radiographic Exam:  Normal osseous mineralization. Joint spaces preserved. No fracture/dislocation/boney destruction.    Assessment: 1. Left midfoot capsulitis    Plan of Care:  1. Patient evaluated. X-Rays reviewed.  2. CAM boot dispensed. Weightbearing for four weeks.  3. Compression anklet dispensed.  4. Recommended using OTC Voltaren Gel.  5. Return to clinic as needed.      Edrick Kins, DPM [facility]  Dr. Edrick Kins, DPM    [address], [place] [address]                 Office [phone]  Fax [phone]

## 2020-02-19 ENCOUNTER — Other Ambulatory Visit: Payer: Self-pay | Admitting: Podiatry

## 2020-02-19 DIAGNOSIS — M778 Other enthesopathies, not elsewhere classified: Secondary | ICD-10-CM

## 2020-03-15 ENCOUNTER — Encounter: Payer: Self-pay | Admitting: Family Medicine

## 2020-03-15 ENCOUNTER — Other Ambulatory Visit: Payer: Self-pay

## 2020-03-15 ENCOUNTER — Ambulatory Visit (INDEPENDENT_AMBULATORY_CARE_PROVIDER_SITE_OTHER): Payer: Self-pay | Admitting: Family Medicine

## 2020-03-15 VITALS — BP 142/76 | HR 91 | Temp 97.5°F | Resp 16 | Ht 64.0 in | Wt 189.5 lb

## 2020-03-15 DIAGNOSIS — R252 Cramp and spasm: Secondary | ICD-10-CM

## 2020-03-15 DIAGNOSIS — G8929 Other chronic pain: Secondary | ICD-10-CM

## 2020-03-15 DIAGNOSIS — R030 Elevated blood-pressure reading, without diagnosis of hypertension: Secondary | ICD-10-CM

## 2020-03-15 DIAGNOSIS — M25561 Pain in right knee: Secondary | ICD-10-CM

## 2020-03-15 DIAGNOSIS — M62551 Muscle wasting and atrophy, not elsewhere classified, right thigh: Secondary | ICD-10-CM

## 2020-03-15 MED ORDER — DICLOFENAC SODIUM 1 % EX GEL: 4.0000 g | Freq: Four times a day (QID) | CUTANEOUS | 1 refills | Status: DC

## 2020-03-15 MED ORDER — BACLOFEN 10 MG PO TABS: 10.0000 mg | ORAL_TABLET | Freq: Three times a day (TID) | ORAL | 1 refills | Status: DC | PRN

## 2020-03-15 NOTE — Progress Notes (Signed)
Name: Barbara Rivers   MRN: [id number]    DOB: [birth year]   Date:03/15/2020       Progress Note  Subjective  Chief Complaint  Chief Complaint  Patient presents with  . Knee Pain    Right knee pain from a work related injury.    HPI  Work related injury/chronic right knee pain/CRPS: she is still having daily pain on right knee, decrease rom , uses a cane for ambulationand when groceries shopping has to use the store cart for support, she wasoff oral nsaid's and Lyrica. She recently resumed taking ibuprofen because she had to stand for a long time to help clean her mother's house and has flared the pain and inflammation, she is using CBD gel and also having frequent massage therapy to help.Shehasatrophy of thigh and right leg weakness.Unable to work secondary to pain and has social security disability. Injury happened 07/2011, settlement 2018. Surgery was in 2013 and quadriceps tendon repair 2014 after a rupture. She states topical medication helps with pain, . Shestates Baclofen helps with muscle spasm of her right quad.She is using [employer] at [employer] feels like it is helping with her hamstringstrength, but unable to use for more than 10 minutes each time if knee is swollen.She states water activity helps a lot with her symptomsbut her pool water is too cold for her at this time, but she will resume this Summer. She states right now pain is 6-7/10 .   Patient Active Problem List   Diagnosis Date Noted  . VV (varicose veins) 07/26/2016  . Venous insufficiency of right leg 07/05/2016  . Obesity (BMI 30.0-34.9) 07/05/2016  . Elevated blood pressure 07/05/2016  . Right knee pain 05/31/2016  . Primary osteoarthritis of right knee 05/31/2016  . Quadriceps tendon rupture 05/31/2016  . 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 Muscle wasting and atrophy, not elsewhere classified, right thigh 05/31/2016    Past Surgical History:  Procedure Laterality Date  . CERVICAL CONIZATION W/BX    . KNEE  ARTHROSCOPY  02/23/2012   Procedure: ARTHROSCOPY KNEE;  Surgeon: Alta Corning, MD;  Location: [facility];  Service: Orthopedics;  Laterality: Right;  Lateral Retinacular Release, Chondroplasty Medial Femoral Condyle and Patella, Partial Lateral Meniscectomy  . QUADRICEPS TENDON REPAIR  11/15/2012   Procedure: REPAIR QUADRICEP TENDON;  Surgeon: Alta Corning, MD;  Location: [place];  Service: Orthopedics;  Laterality: Right;  WITH ALLOGRAFT  . TONSILLECTOMY      Family History  Problem Relation Age of Onset  . Diabetes Father   . Kidney disease Father   . Hypertension Father   . Cancer Maternal Grandmother 11       Breast  . Breast cancer Maternal Grandmother 74  . Diabetes Paternal Grandmother     Social History   Tobacco Use  . Smoking status: Never Smoker  . Smokeless tobacco: Never Used  Substance Use Topics  . Alcohol use: Yes    Comment: 1 glass of wine every several months     Current Outpatient Medications:  .  baclofen (LIORESAL) 10 MG tablet, Take 1 tablet (10 mg total) by mouth 3 (three) times daily as needed for muscle spasms., Disp: 90 tablet, Rfl: 1 .  diclofenac sodium (VOLTAREN) 1 % GEL, Apply 4 g topically 4 (four) times daily., Disp: 300 g, Rfl: 2 .  Glucos-Chondroit-Collag-Hyal (JOINT SUPPORT FORMULA PO), Take by mouth., Disp: , Rfl:  .  Multiple Vitamin (MULTIVITAMIN) tablet, Take 1 tablet by mouth  daily., Disp: , Rfl:  .  Multiple Vitamins-Minerals (EMERGEN-C IMMUNE PLUS/VIT D PO), Take 2 each by mouth daily. Gummies and sometimes the Liquid, Disp: , Rfl:  .  meloxicam (MOBIC) 7.5 MG tablet, Take 1 tablet (7.5 mg total) by mouth 2 (two) times daily. (Patient not taking: Reported on 03/15/2020), Disp: 180 tablet, Rfl: 1  Allergies  Allergen Reactions  . Penicillins Hives    I personally reviewed active problem list, medication list, allergies, family history, social history, health maintenance with the patient/caregiver  today.   ROS  Constitutional: Negative for fever or weight change.  Respiratory: Negative for cough and shortness of breath.   Cardiovascular: Negative for chest pain or palpitations.  Gastrointestinal: Negative for abdominal pain, no bowel changes.  Musculoskeletal:Positive for gait problem and  joint swelling.  Skin: Negative for rash.  Neurological: Negative for dizziness or headache.  No other specific complaints in a complete review of systems (except as listed in HPI above).  Objective  Vitals:   03/15/20 1102  BP: (!) 160/80  Pulse: 91  Resp: 16  Temp: (!) 97.5 F (36.4 C)  TempSrc: Temporal  SpO2: 98%  Weight: 189 lb 8 oz (86 kg)  Height: 5\' 4"  (1.626 m)    Body mass index is 32.53 kg/m.  Physical Exam  Constitutional: Patient appears well-developed and well-nourished. Obese  No distress.  HEENT: head atraumatic, normocephalic, pupils equal and reactive to light Cardiovascular: Normal rate, regular rhythm and normal heart sounds.  No murmur heard. No BLE edema. Pulmonary/Chest: Effort normal and breath sounds normal. No respiratory distress. Abdominal: Soft.  There is no tenderness. Psychiatric: Patient has a normal mood and affect. behavior is normal. Judgment and thought content normal.  PHQ2/9: Depression screen [facility] 2/9 03/15/2020 09/16/2019 06/17/2019 06/12/2019 03/25/2019  Decreased Interest 0 0 0 0 0  Down, Depressed, Hopeless 1 0 0 0 0  PHQ - 2 Score 1 0 0 0 0  Altered sleeping 1 1 - 0 0  Tired, decreased energy 1 1 - 0 0  Change in appetite 0 0 - 0 0  Feeling bad or failure about yourself  0 1 - 0 0  Trouble concentrating 0 0 - 0 0  Moving slowly or fidgety/restless 0 1 - 0 0  Suicidal thoughts 0 0 - 0 0  PHQ-9 Score 3 4 - 0 0  Difficult doing work/chores Not difficult at all Not difficult at all - - Not difficult at all  Some recent data might be hidden    phq 9 is positive   Fall Risk: Fall Risk  03/15/2020 09/16/2019 06/17/2019 06/12/2019  03/25/2019  Falls in the past year? 0 0 0 0 0  Number falls in past yr: 0 0 0 0 0  Injury with Fall? 0 0 0 0 0  Risk Factor Category  - - - - -  Risk for fall due to : - - - - -  Follow up - - Falls prevention discussed - -     Functional Status Survey: Is the patient deaf or have difficulty hearing?: No Does the patient have difficulty seeing, even when wearing glasses/contacts?: No Does the patient have difficulty concentrating, remembering, or making decisions?: No Does the patient have difficulty walking or climbing stairs?: Yes Does the patient have difficulty dressing or bathing?: No Does the patient have difficulty doing errands alone such as visiting a doctor's office or shopping?: No    Assessment & Plan  1. Chronic pain of right  knee  - baclofen (LIORESAL) 10 MG tablet; Take 1 tablet (10 mg total) by mouth 3 (three) times daily as needed for muscle spasms.  Dispense: 90 tablet; Refill: 1  2. Muscle wasting and atrophy, not elsewhere classified, right thigh  - baclofen (LIORESAL) 10 MG tablet; Take 1 tablet (10 mg total) by mouth 3 (three) times daily as needed for muscle spasms.  Dispense: 90 tablet; Refill: 1  3. Spasm  - baclofen (LIORESAL) 10 MG tablet; Take 1 tablet (10 mg total) by mouth 3 (three) times daily as needed for muscle spasms.  Dispense: 90 tablet; Refill: 1  4. Elevated BP without diagnosis of hypertension  She will return for bp check with CMA - she states did not sleep well last night carrying for her mother

## 2020-04-26 DIAGNOSIS — L728 Other follicular cysts of the skin and subcutaneous tissue: Secondary | ICD-10-CM | POA: Diagnosis not present

## 2020-04-26 DIAGNOSIS — L02821 Furuncle of head [any part, except face]: Secondary | ICD-10-CM | POA: Diagnosis not present

## 2020-06-22 ENCOUNTER — Ambulatory Visit: Payer: PPO

## 2020-08-09 ENCOUNTER — Other Ambulatory Visit: Payer: Self-pay | Admitting: Family Medicine

## 2020-08-09 DIAGNOSIS — N63 Unspecified lump in unspecified breast: Secondary | ICD-10-CM

## 2020-08-18 DIAGNOSIS — M7711 Lateral epicondylitis, right elbow: Secondary | ICD-10-CM | POA: Diagnosis not present

## 2020-08-18 DIAGNOSIS — M25521 Pain in right elbow: Secondary | ICD-10-CM | POA: Diagnosis not present

## 2020-09-15 ENCOUNTER — Ambulatory Visit: Payer: PPO | Admitting: Family Medicine

## 2020-11-11 ENCOUNTER — Ambulatory Visit (INDEPENDENT_AMBULATORY_CARE_PROVIDER_SITE_OTHER): Payer: Medicare HMO

## 2020-11-11 DIAGNOSIS — Z0000 Encounter for general adult medical examination without abnormal findings: Secondary | ICD-10-CM

## 2020-11-11 NOTE — Progress Notes (Signed)
Subjective:   Barbara Rivers is a 65 y.o. female who presents for Medicare Annual (Subsequent) preventive examination.  Virtual Visit via Telephone Note  I connected with  Barbara Rivers on 11/11/20 at  1:30 PM EST by telephone and verified that I am speaking with the correct person using two identifiers.  Location: Patient: home Provider: [place] Persons participating in the virtual visit: [place]   I discussed the limitations, risks, security and privacy concerns of performing an evaluation and management service by telephone and the availability of in person appointments. The patient expressed understanding and agreed to proceed.  Interactive audio and video telecommunications were attempted between this nurse and patient, however failed, due to patient having technical difficulties OR patient did not have access to video capability.  We continued and completed visit with audio only.  Some vital signs may be absent or patient reported.   Clemetine Marker, LPN    Review of Systems     Cardiac Risk Factors include: obesity (BMI >30kg/m2)     Objective:    Today's Vitals   11/11/20 1347  PainSc: 6    There is no height or weight on file to calculate BMI.  Advanced Directives 11/11/2020 06/17/2019 07/30/2017 09/18/2016 07/05/2016 11/12/2012 02/20/2012  Does Patient Have a Medical Advance Directive? Yes Yes No No No Patient has advance directive, copy not in chart Patient does not have advance directive  Type of Advance Directive [place];Living will [place];Living will - - - Living will;Healthcare Power of Attorney -  Copy of [place] in Chart? No - copy requested No - copy requested - - - - -  Would patient like information on creating a medical advance directive? - - No - Patient declined No - patient declined information No - patient declined information - -    Current Medications  (verified) Outpatient Encounter Medications as of 11/11/2020  Medication Sig  . baclofen (LIORESAL) 10 MG tablet Take 1 tablet (10 mg total) by mouth 3 (three) times daily as needed for muscle spasms.  . diclofenac Sodium (VOLTAREN) 1 % GEL Apply 4 g topically 4 (four) times daily.  . Glucos-Chondroit-Collag-Hyal (JOINT SUPPORT FORMULA PO) Take by mouth.  . Multiple Vitamin (MULTIVITAMIN) tablet Take 1 tablet by mouth daily.  . [DISCONTINUED] Multiple Vitamins-Minerals (EMERGEN-C IMMUNE PLUS/VIT D PO) Take 2 each by mouth daily. Gummies and sometimes the Liquid   No facility-administered encounter medications on file as of 11/11/2020.    Allergies (verified) Penicillins   History: Past Medical History:  Diagnosis Date  . Arthritis    right knee  . Disorder of jaw    occ. becomes tight and locks with dental procedures  . Immature cataract    left eye  . Quadriceps tendon rupture 11/2012   right   Past Surgical History:  Procedure Laterality Date  . CERVICAL CONIZATION W/BX    . KNEE ARTHROSCOPY  02/23/2012   Procedure: ARTHROSCOPY KNEE;  Surgeon: Alta Corning, MD;  Location: [place];  Service: Orthopedics;  Laterality: Right;  Lateral Retinacular Release, Chondroplasty Medial Femoral Condyle and Patella, Partial Lateral Meniscectomy  . QUADRICEPS TENDON REPAIR  11/15/2012   Procedure: REPAIR QUADRICEP TENDON;  Surgeon: Alta Corning, MD;  Location: [facility];  Service: Orthopedics;  Laterality: Right;  WITH ALLOGRAFT  . TONSILLECTOMY     Family History  Problem Relation Age of Onset  . Diabetes Mother   .  Hypertension Mother   . Congestive Heart Failure Mother   . Diabetes Father   . Kidney disease Father   . Hypertension Father   . Cancer Maternal Grandmother 38       Breast  . Breast cancer Maternal Grandmother 86  . Diabetes Paternal Grandmother    Social History   Socioeconomic History  . Marital status: Married    Spouse name:  Greggory Stallion  . Number of children: 1  . Years of education: Not on file  . Highest education level: Not on file  Occupational History  . Occupation: disabled    Comment: workman's comp   Tobacco Use  . Smoking status: Never Smoker  . Smokeless tobacco: Never Used  Vaping Use  . Vaping Use: Never used  Substance and Sexual Activity  . Alcohol use: Yes    Alcohol/week: 1.0 standard drink    Types: 1 Glasses of wine per week    Comment: rarely  . Drug use: No  . Sexual activity: Never  Other Topics Concern  . Not on file  Social History Narrative   She used to work but since 2013 , had a injury at work and one year later they found out that she had quad surgery, she still has weakness , daily pain and uses a cane. Fully disabled.    Husband also had a stroke in 2015  and works from home now    Her mother moved in with them April 2021. She has multiple medical problems and has been her caregiver    Social Determinants of Health   Financial Resource Strain: Low Risk   . Difficulty of Paying Living Expenses: Not very hard  Food Insecurity: No Food Insecurity  . Worried About Programme researcher, broadcasting/film/video in the Last Year: Never true  . Ran Out of Food in the Last Year: Never true  Transportation Needs: No Transportation Needs  . Lack of Transportation (Medical): No  . Lack of Transportation (Non-Medical): No  Physical Activity: Sufficiently Active  . Days of Exercise per Week: 5 days  . Minutes of Exercise per Session: 30 min  Stress: No Stress Concern Present  . Feeling of Stress : Only a little  Social Connections: Moderately Integrated  . Frequency of Communication with Friends and Family: More than three times a week  . Frequency of Social Gatherings with Friends and Family: More than three times a week  . Attends Religious Services: More than 4 times per year  . Active Member of Clubs or Organizations: No  . Attends Banker Meetings: Never  . Marital Status: Married     Tobacco Counseling Counseling given: Not Answered   Clinical Intake:  Pre-visit preparation completed: Yes  Pain : 0-10 Pain Score: 6  Pain Type: Chronic pain Pain Location: Knee Pain Orientation: Right Pain Descriptors / Indicators: Aching,Discomfort Pain Onset: More than a month ago Pain Frequency: Constant     Nutritional Risks: None Diabetes: No  How often do you need to have someone help you when you read instructions, pamphlets, or other written materials from your doctor or pharmacy?: 1 - Never    Interpreter Needed?: No  Information entered by :: Reather Littler LPN   Activities of Daily Living In your present state of health, do you have any difficulty performing the following activities: 11/11/2020 03/15/2020  Hearing? N N  Comment declines hearing aids -  Vision? N N  Difficulty concentrating or making decisions? N N  Walking or climbing  stairs? Y Y  Dressing or bathing? N N  Doing errands, shopping? N N  Preparing Food and eating ? N -  Using the Toilet? N -  In the past six months, have you accidently leaked urine? N -  Do you have problems with loss of bowel control? N -  Managing your Medications? N -  Managing your Finances? N -  Housekeeping or managing your Housekeeping? N -  Some recent data might be hidden    Patient Care Team: Steele Sizer, MD as PCP - General (Family Medicine) Leandrew Koyanagi, MD as Referring Physician (Ophthalmology)  Indicate any recent Medical Services you may have received from other than [facility] providers in the past year (date may be approximate).     Assessment:   This is a routine wellness examination for Kyan.  Hearing/Vision screen  Hearing Screening   125Hz  250Hz  500Hz  1000Hz  2000Hz  3000Hz  4000Hz  6000Hz  8000Hz   Right ear:           Left ear:           Comments: Pt denies hearing difficulty  Vision Screening Comments: [facility] screenings [facility] at [facility]  Dietary issues and  exercise activities discussed: Current Exercise Habits: Home exercise routine, Type of exercise: Other - see comments (water aerobics, nustep machine), Time (Minutes): 30, Frequency (Times/Week): 5, Weekly Exercise (Minutes/Week): 150, Intensity: Moderate, Exercise limited by: orthopedic condition(s)  Goals    . Weight (lb) < 170 lb (77.1 kg)     Recommend cutting back on portion sizes to aid in loosing 20 lbs.       Depression Screen PHQ 2/9 Scores 11/11/2020 03/15/2020 09/16/2019 06/17/2019 06/12/2019 03/25/2019 09/25/2018  PHQ - 2 Score 0 1 0 0 0 0 0  PHQ- 9 Score - 3 4 - 0 0 3    Fall Risk Fall Risk  11/11/2020 03/15/2020 09/16/2019 06/17/2019 06/12/2019  Falls in the past year? 0 0 0 0 0  Number falls in past yr: 0 0 0 0 0  Injury with Fall? 0 0 0 0 0  Risk Factor Category  - - - - -  Risk for fall due to : Impaired balance/gait;Impaired mobility - - - -  Follow up Falls prevention discussed - - Falls prevention discussed -    FALL RISK PREVENTION PERTAINING TO THE HOME:  Any stairs in or around the home? Yes  If so, are there any without handrails? No  Home free of loose throw rugs in walkways, pet beds, electrical cords, etc? Yes  Adequate lighting in your home to reduce risk of falls? Yes   ASSISTIVE DEVICES UTILIZED TO PREVENT FALLS:  Life alert? No  Use of a cane, walker or w/c? Yes  Grab bars in the bathroom? Yes  Shower chair or bench in shower? Yes  Elevated toilet seat or a handicapped toilet? No   TIMED UP AND GO:  Was the test performed? No . Telephonic visit  Cognitive Function: Normal cognitive status assessed by direct observation by this Nurse Health Advisor. No abnormalities found.          Immunizations Immunization History  Administered Date(s) Administered  . Influenza Inj Mdck Quad Pf 09/16/2019  . Influenza,inj,Quad PF,6+ Mos 09/18/2016, 09/25/2017, 09/11/2018  . Zoster Recombinat (Shingrix) 03/25/2018, 08/22/2018    TDAP status: Due, Education  has been provided regarding the importance of this vaccine. Advised may receive this vaccine at local pharmacy or Health Dept. Aware to provide a copy of the vaccination  record if obtained from local pharmacy or Health Dept. Verbalized acceptance and understanding.  Flu Vaccine status: Due, Education has been provided regarding the importance of this vaccine. Advised may receive this vaccine at local pharmacy or Health Dept. Aware to provide a copy of the vaccination record if obtained from local pharmacy or Health Dept. Verbalized acceptance and understanding.  Pneumococcal vaccine status: due at age 87  Covid-19 vaccine status: Declined, Education has been provided regarding the importance of this vaccine but patient still declined. Advised may receive this vaccine at local pharmacy or Health Dept.or vaccine clinic. Aware to provide a copy of the vaccination record if obtained from local pharmacy or Health Dept. Verbalized acceptance and understanding.  Qualifies for Shingles Vaccine? Yes   Zostavax completed No   Shingrix Completed?: Yes  Screening Tests Health Maintenance  Topic Date Due  . DEXA SCAN  10/11/2018  . INFLUENZA VACCINE  06/06/2020  . COVID-19 Vaccine (1) 11/27/2020 (Originally 04/14/1968)  . TETANUS/TDAP  03/15/2021 (Originally 04/14/1976)  . HIV Screening  08/16/2029 (Originally [birth year]/14/1972)  . MAMMOGRAM  08/06/2021  . PAP SMEAR-Modifier  06/11/2022  . Fecal DNA (Cologuard)  08/19/2022  . Hepatitis C Screening  Completed    Health Maintenance  Health Maintenance Due  Topic Date Due  . DEXA SCAN  10/11/2018  . INFLUENZA VACCINE  06/06/2020    Colorectal cancer screening: Type of screening: Cologuard. Completed 08/20/19. Repeat every 3 years  Mammogram status: Completed 08/07/19. Repeat every year  Bone Density status: Completed 10/11/16. Results reflect: Bone density results: NORMAL. Repeat every 3-5 years.  Lung Cancer Screening: (Low Dose CT Chest recommended if Age  69-80 years, 30 pack-year currently smoking OR have quit w/in 15years.) does not qualify.   Additional Screening:  Hepatitis C Screening: does qualify; Completed 07/20/16   Vision Screening: Recommended annual ophthalmology exams for early detection of glaucoma and other disorders of the eye. Is the patient up to date with their annual eye exam?  Yes  Who is the provider or what is the name of the office in which the patient attends annual eye exams? [facility]  Dental Screening: Recommended annual dental exams for proper oral hygiene  Community Resource Referral / Chronic Care Management: CRR required this visit?  No   CCM required this visit?  No      Plan:     I have personally reviewed and noted the following in the patient's chart:   . Medical and social history . Use of alcohol, tobacco or illicit drugs  . Current medications and supplements . Functional ability and status . Nutritional status . Physical activity . Advanced directives . List of other physicians . Hospitalizations, surgeries, and ER visits in previous 12 months . Vitals . Screenings to include cognitive, depression, and falls . Referrals and appointments  In addition, I have reviewed and discussed with patient certain preventive protocols, quality metrics, and best practice recommendations. A written personalized care plan for preventive services as well as general preventive health recommendations were provided to patient.     Reather Littler, LPN   07/13/262   Nurse Notes: none

## 2020-11-11 NOTE — Patient Instructions (Signed)
Barbara Rivers , Thank you for taking time to come for your Medicare Wellness Visit. I appreciate your ongoing commitment to your health goals. Please review the following plan we discussed and let me know if I can assist you in the future.   Screening recommendations/referrals: Colonoscopy: Cologuard done 08/20/19 Mammogram: done 08/07/19. Please call [phone] to schedule your mammogram.  Bone Density: done 10/11/16 Recommended yearly ophthalmology/optometry visit for glaucoma screening and checkup Recommended yearly dental visit for hygiene and checkup  Vaccinations: Influenza vaccine: due Pneumococcal vaccine: due at age 43 Tdap vaccine: due Shingles vaccine: done 03/25/18 & 08/22/18  Covid-19: declined  Advanced directives: Please bring a copy of your health care power of attorney and living will to the office at your convenience.  Conditions/risks identified: Keep up the great work!  Next appointment: Follow up in one year for your annual wellness visit.   Preventive Care 40-64 Years, Female Preventive care refers to lifestyle choices and visits with your health care provider that can promote health and wellness. What does preventive care include?  A yearly physical exam. This is also called an annual well check.  Dental exams once or twice a year.  Routine eye exams. Ask your health care provider how often you should have your eyes checked.  Personal lifestyle choices, including:  Daily care of your teeth and gums.  Regular physical activity.  Eating a healthy diet.  Avoiding tobacco and drug use.  Limiting alcohol use.  Practicing safe sex.  Taking low-dose aspirin daily starting at age 48.  Taking vitamin and mineral supplements as recommended by your health care provider. What happens during an annual well check? The services and screenings done by your health care provider during your annual well check will depend on your age, overall health, lifestyle risk  factors, and family history of disease. Counseling  Your health care provider may ask you questions about your:  Alcohol use.  Tobacco use.  Drug use.  Emotional well-being.  Home and relationship well-being.  Sexual activity.  Eating habits.  Work and work Statistician.  Method of birth control.  Menstrual cycle.  Pregnancy history. Screening  You may have the following tests or measurements:  Height, weight, and BMI.  Blood pressure.  Lipid and cholesterol levels. These may be checked every 5 years, or more frequently if you are over 82 years old.  Skin check.  Lung cancer screening. You may have this screening every year starting at age 78 if you have a 30-pack-year history of smoking and currently smoke or have quit within the past 15 years.  Fecal occult blood test (FOBT) of the stool. You may have this test every year starting at age 81.  Flexible sigmoidoscopy or colonoscopy. You may have a sigmoidoscopy every 5 years or a colonoscopy every 10 years starting at age 81.  Hepatitis C blood test.  Hepatitis B blood test.  Sexually transmitted disease (STD) testing.  Diabetes screening. This is done by checking your blood sugar (glucose) after you have not eaten for a while (fasting). You may have this done every 1-3 years.  Mammogram. This may be done every 1-2 years. Talk to your health care provider about when you should start having regular mammograms. This may depend on whether you have a family history of breast cancer.  BRCA-related cancer screening. This may be done if you have a family history of breast, ovarian, tubal, or peritoneal cancers.  Pelvic exam and Pap test. This may be done every 3  years starting at age 60. Starting at age 65, this may be done every 5 years if you have a Pap test in combination with an HPV test.  Bone density scan. This is done to screen for osteoporosis. You may have this scan if you are at high risk for  osteoporosis. Discuss your test results, treatment options, and if necessary, the need for more tests with your health care provider. Vaccines  Your health care provider may recommend certain vaccines, such as:  Influenza vaccine. This is recommended every year.  Tetanus, diphtheria, and acellular pertussis (Tdap, Td) vaccine. You may need a Td booster every 10 years.  Zoster vaccine. You may need this after age 78.  Pneumococcal 13-valent conjugate (PCV13) vaccine. You may need this if you have certain conditions and were not previously vaccinated.  Pneumococcal polysaccharide (PPSV23) vaccine. You may need one or two doses if you smoke cigarettes or if you have certain conditions. Talk to your health care provider about which screenings and vaccines you need and how often you need them. This information is not intended to replace advice given to you by your health care provider. Make sure you discuss any questions you have with your health care provider. Document Released: 11/19/2015 Document Revised: 07/12/2016 Document Reviewed: 08/24/2015 Elsevier Interactive Patient Education  2017 [place] Prevention in the Home Falls can cause injuries. They can happen to people of all ages. There are many things you can do to make your home safe and to help prevent falls. What can I do on the outside of my home?  Regularly fix the edges of walkways and driveways and fix any cracks.  Remove anything that might make you trip as you walk through a door, such as a raised step or threshold.  Trim any bushes or trees on the path to your home.  Use bright outdoor lighting.  Clear any walking paths of anything that might make someone trip, such as rocks or tools.  Regularly check to see if handrails are loose or broken. Make sure that both sides of any steps have handrails.  Any raised decks and porches should have guardrails on the edges.  Have any leaves, snow, or ice cleared  regularly.  Use sand or salt on walking paths during winter.  Clean up any spills in your garage right away. This includes oil or grease spills. What can I do in the bathroom?  Use night lights.  Install grab bars by the toilet and in the tub and shower. Do not use towel bars as grab bars.  Use non-skid mats or decals in the tub or shower.  If you need to sit down in the shower, use a plastic, non-slip stool.  Keep the floor dry. Clean up any water that spills on the floor as soon as it happens.  Remove soap buildup in the tub or shower regularly.  Attach bath mats securely with double-sided non-slip rug tape.  Do not have throw rugs and other things on the floor that can make you trip. What can I do in the bedroom?  Use night lights.  Make sure that you have a light by your bed that is easy to reach.  Do not use any sheets or blankets that are too big for your bed. They should not hang down onto the floor.  Have a firm chair that has side arms. You can use this for support while you get dressed.  Do not have throw rugs  and other things on the floor that can make you trip. What can I do in the kitchen?  Clean up any spills right away.  Avoid walking on wet floors.  Keep items that you use a lot in easy-to-reach places.  If you need to reach something above you, use a strong step stool that has a grab bar.  Keep electrical cords out of the way.  Do not use floor polish or wax that makes floors slippery. If you must use wax, use non-skid floor wax.  Do not have throw rugs and other things on the floor that can make you trip. What can I do with my stairs?  Do not leave any items on the stairs.  Make sure that there are handrails on both sides of the stairs and use them. Fix handrails that are broken or loose. Make sure that handrails are as long as the stairways.  Check any carpeting to make sure that it is firmly attached to the stairs. Fix any carpet that is loose  or worn.  Avoid having throw rugs at the top or bottom of the stairs. If you do have throw rugs, attach them to the floor with carpet tape.  Make sure that you have a light switch at the top of the stairs and the bottom of the stairs. If you do not have them, ask someone to add them for you. What else can I do to help prevent falls?  Wear shoes that:  Do not have high heels.  Have rubber bottoms.  Are comfortable and fit you well.  Are closed at the toe. Do not wear sandals.  If you use a stepladder:  Make sure that it is fully opened. Do not climb a closed stepladder.  Make sure that both sides of the stepladder are locked into place.  Ask someone to hold it for you, if possible.  Clearly mark and make sure that you can see:  Any grab bars or handrails.  First and last steps.  Where the edge of each step is.  Use tools that help you move around (mobility aids) if they are needed. These include:  Canes.  Walkers.  Scooters.  Crutches.  Turn on the lights when you go into a dark area. Replace any light bulbs as soon as they burn out.  Set up your furniture so you have a clear path. Avoid moving your furniture around.  If any of your floors are uneven, fix them.  If there are any pets around you, be aware of where they are.  Review your medicines with your doctor. Some medicines can make you feel dizzy. This can increase your chance of falling. Ask your doctor what other things that you can do to help prevent falls. This information is not intended to replace advice given to you by your health care provider. Make sure you discuss any questions you have with your health care provider. Document Released: 08/19/2009 Document Revised: 03/30/2016 Document Reviewed: 11/27/2014 Elsevier Interactive Patient Education  2017 [employer].

## 2020-12-07 ENCOUNTER — Encounter: Payer: Self-pay | Admitting: Family Medicine

## 2020-12-07 ENCOUNTER — Other Ambulatory Visit: Payer: Self-pay

## 2020-12-07 ENCOUNTER — Ambulatory Visit (INDEPENDENT_AMBULATORY_CARE_PROVIDER_SITE_OTHER): Payer: Medicare HMO | Admitting: Family Medicine

## 2020-12-07 VITALS — BP 140/70 | HR 87 | Temp 98.3°F | Resp 16 | Ht 64.0 in | Wt 192.1 lb

## 2020-12-07 DIAGNOSIS — Z23 Encounter for immunization: Secondary | ICD-10-CM

## 2020-12-07 DIAGNOSIS — Z79899 Other long term (current) drug therapy: Secondary | ICD-10-CM

## 2020-12-07 DIAGNOSIS — E785 Hyperlipidemia, unspecified: Secondary | ICD-10-CM | POA: Diagnosis not present

## 2020-12-07 DIAGNOSIS — R030 Elevated blood-pressure reading, without diagnosis of hypertension: Secondary | ICD-10-CM | POA: Diagnosis not present

## 2020-12-07 DIAGNOSIS — R739 Hyperglycemia, unspecified: Secondary | ICD-10-CM | POA: Diagnosis not present

## 2020-12-07 DIAGNOSIS — Z1231 Encounter for screening mammogram for malignant neoplasm of breast: Secondary | ICD-10-CM

## 2020-12-07 DIAGNOSIS — R300 Dysuria: Secondary | ICD-10-CM

## 2020-12-07 DIAGNOSIS — Z0000 Encounter for general adult medical examination without abnormal findings: Secondary | ICD-10-CM

## 2020-12-07 LAB — COMPLETE METABOLIC PANEL WITH GFR
BUN: 13 mg/dL (ref 7–25)
Calcium: 9.7 mg/dL (ref 8.6–10.4)
Total Protein: 7.5 g/dL (ref 6.1–8.1)

## 2020-12-07 LAB — CBC WITH DIFFERENTIAL/PLATELET
Eosinophils Relative: 2.7 %
Monocytes Relative: 9.3 %

## 2020-12-07 LAB — LIPID PANEL
Non-HDL Cholesterol (Calc): 164 mg/dL (calc) — ABNORMAL HIGH (ref <130)
Triglycerides: 138 mg/dL (ref <150)

## 2020-12-07 NOTE — Patient Instructions (Signed)
Preventive Care 84-64 Years Old, Female Preventive care refers to lifestyle choices and visits with your health care provider that can promote health and wellness. This includes:  A yearly physical exam. This is also called an annual wellness visit.  Regular dental and eye exams.  Immunizations.  Screening for certain conditions.  Healthy lifestyle choices, such as: ? Eating a healthy diet. ? Getting regular exercise. ? Not using drugs or products that contain nicotine and tobacco. ? Limiting alcohol use. What can I expect for my preventive care visit? Physical exam Your health care provider will check your:  Height and weight. These may be used to calculate your BMI (body mass index). BMI is a measurement that tells if you are at a healthy weight.  Heart rate and blood pressure.  Body temperature.  Skin for abnormal spots. Counseling Your health care provider may ask you questions about your:  Past medical problems.  Family's medical history.  Alcohol, tobacco, and drug use.  Emotional well-being.  Home life and relationship well-being.  Sexual activity.  Diet, exercise, and sleep habits.  Work and work Statistician.  Access to firearms.  Method of birth control.  Menstrual cycle.  Pregnancy history. What immunizations do I need? Vaccines are usually given at various ages, according to a schedule. Your health care provider will recommend vaccines for you based on your age, medical history, and lifestyle or other factors, such as travel or where you work.   What tests do I need? Blood tests  Lipid and cholesterol levels. These may be checked every 5 years, or more often if you are over 3 years old.  Hepatitis C test.  Hepatitis B test. Screening  Lung cancer screening. You may have this screening every year starting at age 73 if you have a 30-pack-year history of smoking and currently smoke or have quit within the past 15 years.  Colorectal cancer  screening. ? All adults should have this screening starting at age 52 and continuing until age 17. ? Your health care provider may recommend screening at age 49 if you are at increased risk. ? You will have tests every 1-10 years, depending on your results and the type of screening test.  Diabetes screening. ? This is done by checking your blood sugar (glucose) after you have not eaten for a while (fasting). ? You may have this done every 1-3 years.  Mammogram. ? This may be done every 1-2 years. ? Talk with your health care provider about when you should start having regular mammograms. This may depend on whether you have a family history of breast cancer.  BRCA-related cancer screening. This may be done if you have a family history of breast, ovarian, tubal, or peritoneal cancers.  Pelvic exam and Pap test. ? This may be done every 3 years starting at age 10. ? Starting at age 11, this may be done every 5 years if you have a Pap test in combination with an HPV test. Other tests  STD (sexually transmitted disease) testing, if you are at risk.  Bone density scan. This is done to screen for osteoporosis. You may have this scan if you are at high risk for osteoporosis. Talk with your health care provider about your test results, treatment options, and if necessary, the need for more tests. Follow these instructions at home: Eating and drinking  Eat a diet that includes fresh fruits and vegetables, whole grains, lean protein, and low-fat dairy products.  Take vitamin and mineral supplements  as recommended by your health care provider.  Do not drink alcohol if: ? Your health care provider tells you not to drink. ? You are pregnant, may be pregnant, or are planning to become pregnant.  If you drink alcohol: ? Limit how much you have to 0-1 drink a day. ? Be aware of how much alcohol is in your drink. In the [place] one drink equals one 12 oz bottle of beer (355 mL), one 5 oz glass of  wine (148 mL), or one 1 oz glass of hard liquor (44 mL).   Lifestyle  Take daily care of your teeth and gums. Brush your teeth every morning and night with fluoride toothpaste. Floss one time each day.  Stay active. Exercise for at least 30 minutes 5 or more days each week.  Do not use any products that contain nicotine or tobacco, such as cigarettes, e-cigarettes, and chewing tobacco. If you need help quitting, ask your health care provider.  Do not use drugs.  If you are sexually active, practice safe sex. Use a condom or other form of protection to prevent STIs (sexually transmitted infections).  If you do not wish to become pregnant, use a form of birth control. If you plan to become pregnant, see your health care provider for a prepregnancy visit.  If told by your health care provider, take low-dose aspirin daily starting at age 50.  Find healthy ways to cope with stress, such as: ? Meditation, yoga, or listening to music. ? Journaling. ? Talking to a trusted person. ? Spending time with friends and family. Safety  Always wear your seat belt while driving or riding in a vehicle.  Do not drive: ? If you have been drinking alcohol. Do not ride with someone who has been drinking. ? When you are tired or distracted. ? While texting.  Wear a helmet and other protective equipment during sports activities.  If you have firearms in your house, make sure you follow all gun safety procedures. What's next?  Visit your health care provider once a year for an annual wellness visit.  Ask your health care provider how often you should have your eyes and teeth checked.  Stay up to date on all vaccines. This information is not intended to replace advice given to you by your health care provider. Make sure you discuss any questions you have with your health care provider. Document Revised: 07/27/2020 Document Reviewed: 07/04/2018 Elsevier Patient Education  2021 [employer]  

## 2020-12-07 NOTE — Progress Notes (Signed)
Name: Barbara Rivers   MRN: [id number]    DOB: [birth year]   Date:12/07/2020       Progress Note  Subjective  Chief Complaint  Annual Exam  HPI  Patient presents for annual CPE and follow up.  Dyslipidemia: she is not on statin therapy, we will recheck levels  Elevated BP ;possible white coat hypertension, normalized after rest  Hyperglycemia: positive family history of diabetes, she is not a big sugar eater, last A1C was diabetes range but two years ago, we will recheck it and if still above 6.5 % we will change diagnosis for DM and start Metformin. She denies polyphagia, she has episodes of urinary frequency, no polyuria.   Dysuria: going on for the past few days with dysuria and urinary frequency, no fever or chills, or upper back pain. Taking more bubble baths due to increase in right knee pain  Right knee pain / chronic: seen by Ortho last Fall had synvisc, Still has massage therapy    Diet: balanced  Exercise: water exercises   Flowsheet Row Office Visit from 12/07/2020 in [facility]  AUDIT-C Score 2     Depression: Phq 9 is  negative Depression screen [facility] 2/9 12/07/2020 11/11/2020 03/15/2020 09/16/2019 06/17/2019  Decreased Interest 0 0 0 0 0  Down, Depressed, Hopeless 0 0 1 0 0  PHQ - 2 Score 0 0 1 0 0  Altered sleeping - - 1 1 -  Tired, decreased energy - - 1 1 -  Change in appetite - - 0 0 -  Feeling bad or failure about yourself  - - 0 1 -  Trouble concentrating - - 0 0 -  Moving slowly or fidgety/restless - - 0 1 -  Suicidal thoughts - - 0 0 -  PHQ-9 Score - - 3 4 -  Difficult doing work/chores - - Not difficult at all Not difficult at all -  Some recent data might be hidden   Hypertension: BP Readings from Last 3 Encounters:  12/07/20 140/70  03/15/20 (!) 142/76  09/16/19 134/82   Obesity: Wt Readings from Last 3 Encounters:  12/07/20 192 lb 1.6 oz (87.1 kg)  03/15/20 189 lb 8 oz (86 kg)  09/16/19 194 lb 6.4 oz (88.2 kg)   BMI Readings  from Last 3 Encounters:  12/07/20 32.97 kg/m  03/15/20 32.53 kg/m  09/16/19 33.37 kg/m     Vaccines:   Pneumonia:  educated and discussed with patient. Flu:  educated and discussed with patient. We are out of flu vaccine   Hep C Screening: 07/20/16 STD testing and prevention (HIV/chl/gon/syphilis): N/A Intimate partner violence: negative Sexual History : not sexually active, husband had strokes  Menstrual History/LMP/Abnormal Bleeding: discussed post-menopausal bleeding  Incontinence Symptoms:     Breast cancer:  - Last Mammogram: 08/07/19 - BRCA gene screening: N/A  Osteoporosis: Discussed high calcium and vitamin D supplementation, weight bearing exercises  Cervical cancer screening: 06/12/19  Skin cancer: Discussed monitoring for atypical lesions  Colorectal cancer: N/A   Lung cancer:  Low Dose CT Chest recommended if Age 20-80 years, 20 pack-year currently smoking OR have quit w/in 15years. Patient does not qualify.   ECG: 09/18/16  Advanced Care Planning: A voluntary discussion about advance care planning including the explanation and discussion of advance directives.  Discussed health care proxy and Living will, and the patient was able to identify a health care proxy as husband .  Lipids: Lab Results  Component Value Date   CHOL 212 (  H) 06/12/2019   CHOL 216 (H) 07/19/2016   Lab Results  Component Value Date   HDL 70 06/12/2019   HDL 61 07/19/2016   Lab Results  Component Value Date   LDLCALC 121 (H) 06/12/2019   LDLCALC 123 07/19/2016   Lab Results  Component Value Date   TRIG 106 06/12/2019   TRIG 162 (H) 07/19/2016   Lab Results  Component Value Date   CHOLHDL 3.0 06/12/2019   CHOLHDL 3.5 07/19/2016   No results found for: LDLDIRECT  Glucose: Glucose, Bld  Date Value Ref Range Status  06/12/2019 114 (H) 65 - 99 mg/dL Final    Comment:    .            Fasting reference interval . For someone without known diabetes, a glucose value between  100 and 125 mg/dL is consistent with prediabetes and should be confirmed with a follow-up test. .   07/19/2016 113 (H) 65 - 99 mg/dL Final    Patient Active Problem List   Diagnosis Date Noted  . VV (varicose veins) 07/26/2016  . Venous insufficiency of right leg 07/05/2016  . Obesity (BMI 30.0-34.9) 07/05/2016  . Elevated blood pressure 07/05/2016  . Right knee pain 05/31/2016  . Primary osteoarthritis of right knee 05/31/2016  . Quadriceps tendon rupture 05/31/2016  . 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 Muscle wasting and atrophy, not elsewhere classified, right thigh 05/31/2016    Past Surgical History:  Procedure Laterality Date  . CERVICAL CONIZATION W/BX    . KNEE ARTHROSCOPY  02/23/2012   Procedure: ARTHROSCOPY KNEE;  Surgeon: Harvie Junior, MD;  Location: [facility];  Service: Orthopedics;  Laterality: Right;  Lateral Retinacular Release, Chondroplasty Medial Femoral Condyle and Patella, Partial Lateral Meniscectomy  . QUADRICEPS TENDON REPAIR  11/15/2012   Procedure: REPAIR QUADRICEP TENDON;  Surgeon: Harvie Junior, MD;  Location: [facility];  Service: Orthopedics;  Laterality: Right;  WITH ALLOGRAFT  . TONSILLECTOMY      Family History  Problem Relation Age of Onset  . Diabetes Mother   . Hypertension Mother   . Congestive Heart Failure Mother   . Diabetes Father   . Kidney disease Father   . Hypertension Father   . Cancer Maternal Grandmother 40       Breast  . Breast cancer Maternal Grandmother 54  . Diabetes Paternal Grandmother     Social History   Socioeconomic History  . Marital status: Married    Spouse name: Greggory Stallion  . Number of children: 1  . Years of education: Not on file  . Highest education level: Not on file  Occupational History  . Occupation: disabled    Comment: workman's comp   Tobacco Use  . Smoking status: Never Smoker  . Smokeless tobacco: Never Used  Vaping Use  . Vaping Use: Never used  Substance and  Sexual Activity  . Alcohol use: Yes    Alcohol/week: 1.0 standard drink    Types: 1 Glasses of wine per week    Comment: rarely  . Drug use: No  . Sexual activity: Never  Other Topics Concern  . Not on file  Social History Narrative   She used to work but since 2013 , had a injury at work and one year later they found out that she had quad surgery, she still has weakness , daily pain and uses a cane. Fully disabled.    Husband also had a stroke in 2015  and  works from home now    Her mother moved in with them April 2021. She has multiple medical problems and has been her caregiver    Social Determinants of Health   Financial Resource Strain: Low Risk   . Difficulty of Paying Living Expenses: Not hard at all  Food Insecurity: No Food Insecurity  . Worried About Charity fundraiser in the Last Year: Never true  . Ran Out of Food in the Last Year: Never true  Transportation Needs: No Transportation Needs  . Lack of Transportation (Medical): No  . Lack of Transportation (Non-Medical): No  Physical Activity: Insufficiently Active  . Days of Exercise per Week: 4 days  . Minutes of Exercise per Session: 30 min  Stress: Stress Concern Present  . Feeling of Stress : To some extent  Social Connections: Moderately Integrated  . Frequency of Communication with Friends and Family: More than three times a week  . Frequency of Social Gatherings with Friends and Family: More than three times a week  . Attends Religious Services: More than 4 times per year  . Active Member of Clubs or Organizations: No  . Attends Archivist Meetings: Never  . Marital Status: Married  Human resources officer Violence: Not At Risk  . Fear of Current or Ex-Partner: No  . Emotionally Abused: No  . Physically Abused: No  . Sexually Abused: No     Current Outpatient Medications:  .  baclofen (LIORESAL) 10 MG tablet, Take 1 tablet (10 mg total) by mouth 3 (three) times daily as needed for muscle spasms.,  Disp: 90 tablet, Rfl: 1 .  diclofenac Sodium (VOLTAREN) 1 % GEL, Apply 4 g topically 4 (four) times daily., Disp: 300 g, Rfl: 1 .  Glucos-Chondroit-Collag-Hyal (JOINT SUPPORT FORMULA PO), Take by mouth., Disp: , Rfl:  .  Multiple Vitamin (MULTIVITAMIN) tablet, Take 1 tablet by mouth daily., Disp: , Rfl:   Allergies  Allergen Reactions  . Penicillins Hives     ROS  Constitutional: Negative for fever or weight change.  Respiratory: Negative for cough and shortness of breath.   Cardiovascular: Negative for chest pain or palpitations.  Gastrointestinal: Negative for abdominal pain, no bowel changes.  Musculoskeletal: positive  for gait problem and right knee  joint swelling.  Skin: Negative for rash.  Neurological: Negative for dizziness or headache.  No other specific complaints in a complete review of systems (except as listed in HPI above).  Objective  Vitals:   12/07/20 1056 12/07/20 1104  BP: (!) 150/90 140/70  Pulse: 87   Resp: 16   Temp: 98.3 F (36.8 C)   TempSrc: Oral   SpO2: 99%   Weight: 192 lb 1.6 oz (87.1 kg)   Height: $Remove'5\' 4"'yRUnviT$  (1.626 m)     Body mass index is 32.97 kg/m.  Physical Exam  Constitutional: Patient appears well-developed and well-nourished. No distress.  HENT: Head: Normocephalic and atraumatic. Ears: B TMs ok, no erythema or effusion; Nose: Not done . Mouth/Throat: not done  Eyes: Conjunctivae and EOM are normal. Pupils are equal, round, and reactive to light. No scleral icterus.  Neck: Normal range of motion. Neck supple. No JVD present. No thyromegaly present.  Cardiovascular: Normal rate, regular rhythm and normal heart sounds.  No murmur heard. No BLE edema. Pulmonary/Chest: Effort normal and breath sounds normal. No respiratory distress. Abdominal: Soft. Bowel sounds are normal, no distension. There is no tenderness. no masses Breast: no lumps or masses, no nipple discharge or rashes FEMALE GENITALIA:  Not done  RECTAL: not done   Musculoskeletal: uses a cane, right knee effusion, decrease rom of right knee  Neurological: he is alert and oriented to person, place, and time. No cranial nerve deficit. Coordination, balance, strength, speech  Skin: Skin is warm and dry. No rash noted. No erythema.  Psychiatric: Patient has a normal mood and affect. behavior is normal. Judgment and thought content normal.  Fall Risk: Fall Risk  12/07/2020 11/11/2020 03/15/2020 09/16/2019 06/17/2019  Falls in the past year? 0 0 0 0 0  Number falls in past yr: 0 0 0 0 0  Injury with Fall? 0 0 0 0 0  Risk Factor Category  - - - - -  Risk for fall due to : - Impaired balance/gait;Impaired mobility - - -  Follow up - Falls prevention discussed - - Falls prevention discussed     Functional Status Survey: Is the patient deaf or have difficulty hearing?: No Does the patient have difficulty seeing, even when wearing glasses/contacts?: No Does the patient have difficulty concentrating, remembering, or making decisions?: No Does the patient have difficulty walking or climbing stairs?: Yes Does the patient have difficulty dressing or bathing?: No Does the patient have difficulty doing errands alone such as visiting a doctor's office or shopping?: Yes   Assessment & Plan  1. Well adult exam   2. Encounter for screening mammogram for breast cancer  - MM 3D SCREEN BREAST BILATERAL; Future  3. Hyperglycemia  - Hemoglobin A1c  4. Dyslipidemia  - Lipid panel  5. Elevated BP without diagnosis of hypertension  - CBC with Differential/Platelet - COMPLETE METABOLIC PANEL WITH GFR  6. Long-term use of high-risk medication  - CBC with Differential/Platelet - COMPLETE METABOLIC PANEL WITH GFR  7. Needs flu shot  Out of stock   8. Dysuria  - CULTURE, URINE COMPREHENSIVE  -USPSTF grade A and B recommendations reviewed with patient; age-appropriate recommendations, preventive care, screening tests, etc discussed and encouraged; healthy  living encouraged; see AVS for patient education given to patient -Discussed importance of 150 minutes of physical activity weekly, eat two servings of fish weekly, eat one serving of tree nuts ( cashews, pistachios, pecans, almonds.Marland Kitchen) every other day, eat 6 servings of fruit/vegetables daily and drink plenty of water and avoid sweet beverages.

## 2020-12-08 LAB — COMPLETE METABOLIC PANEL WITH GFR
Albumin: 4.2 g/dL (ref 3.6–5.1)
Chloride: 103 mmol/L (ref 98–110)
Creat: 0.75 mg/dL (ref 0.50–0.99)
GFR, Est Non African American: 85 mL/min/{1.73_m2} (ref >=60)
Globulin: 3.3 g/dL (calc) (ref 1.9–3.7)
Sodium: 139 mmol/L (ref 135–146)

## 2020-12-08 LAB — CBC WITH DIFFERENTIAL/PLATELET: Basophils Absolute: 87 cells/uL (ref 0–200)

## 2020-12-08 LAB — LIPID PANEL: LDL Cholesterol (Calc): 137 mg/dL (calc) — ABNORMAL HIGH

## 2020-12-09 LAB — LIPID PANEL
Cholesterol: 240 mg/dL — ABNORMAL HIGH (ref <200)
HDL: 76 mg/dL (ref >=50)
Total CHOL/HDL Ratio: 3.2 (calc) (ref <5.0)

## 2020-12-09 LAB — CBC WITH DIFFERENTIAL/PLATELET
Absolute Monocytes: 623 cells/uL (ref 200–950)
Basophils Relative: 1.3 %
Eosinophils Absolute: 181 cells/uL (ref 15–500)
HCT: 43.3 % (ref 35.0–45.0)
Hemoglobin: 14.8 g/dL (ref 11.7–15.5)
Lymphs Abs: 2318 cells/uL (ref 850–3900)
MCH: 30.5 pg (ref 27.0–33.0)
MCHC: 34.2 g/dL (ref 32.0–36.0)
MCV: 89.3 fL (ref 80.0–100.0)
MPV: 12.3 fL (ref 7.5–12.5)
Neutro Abs: 3491 cells/uL (ref 1500–7800)
Neutrophils Relative %: 52.1 %
Platelets: 332 10*3/uL (ref 140–400)
RBC: 4.85 10*6/uL (ref 3.80–5.10)
RDW: 12 % (ref 11.0–15.0)
Total Lymphocyte: 34.6 %
WBC: 6.7 10*3/uL (ref 3.8–10.8)

## 2020-12-09 LAB — COMPLETE METABOLIC PANEL WITH GFR
AG Ratio: 1.3 (calc) (ref 1.0–2.5)
ALT: 23 U/L (ref 6–29)
AST: 19 U/L (ref 10–35)
Alkaline phosphatase (APISO): 111 U/L (ref 37–153)
CO2: 28 mmol/L (ref 20–32)
GFR, Est African American: 98 mL/min/{1.73_m2} (ref >=60)
Glucose, Bld: 113 mg/dL — ABNORMAL HIGH (ref 65–99)
Potassium: 4.6 mmol/L (ref 3.5–5.3)
Total Bilirubin: 0.5 mg/dL (ref 0.2–1.2)

## 2020-12-09 LAB — MICROALBUMIN / CREATININE URINE RATIO
Creatinine, Urine: 46 mg/dL (ref 20–275)
Microalb Creat Ratio: 4 mcg/mg creat (ref <30)
Microalb, Ur: 0.2 mg/dL

## 2020-12-09 LAB — HEMOGLOBIN A1C
Hgb A1c MFr Bld: 6.7 % of total Hgb — ABNORMAL HIGH (ref <5.7)
Mean Plasma Glucose: 146 mg/dL
eAG (mmol/L): 8.1 mmol/L

## 2020-12-09 LAB — CULTURE, URINE COMPREHENSIVE
MICRO NUMBER:: 11484965
RESULT:: NO GROWTH
SPECIMEN QUALITY:: ADEQUATE

## 2020-12-28 ENCOUNTER — Other Ambulatory Visit: Payer: Self-pay

## 2020-12-28 ENCOUNTER — Ambulatory Visit
Admission: RE | Admit: 2020-12-28 | Discharge: 2020-12-28 | Disposition: A | Discharge status: Home / Self Care | Payer: Medicare HMO | Source: Ambulatory Visit | Attending: Family Medicine | Admitting: Family Medicine

## 2020-12-28 DIAGNOSIS — Z1231 Encounter for screening mammogram for malignant neoplasm of breast: Secondary | ICD-10-CM

## 2021-02-28 DIAGNOSIS — Z0100 Encounter for examination of eyes and vision without abnormal findings: Secondary | ICD-10-CM | POA: Diagnosis not present

## 2021-02-28 DIAGNOSIS — H2513 Age-related nuclear cataract, bilateral: Secondary | ICD-10-CM | POA: Diagnosis not present

## 2021-04-15 DIAGNOSIS — M5489 Other dorsalgia: Secondary | ICD-10-CM | POA: Diagnosis not present

## 2021-04-15 DIAGNOSIS — M5134 Other intervertebral disc degeneration, thoracic region: Secondary | ICD-10-CM | POA: Diagnosis not present

## 2021-04-15 DIAGNOSIS — M62830 Muscle spasm of back: Secondary | ICD-10-CM | POA: Diagnosis not present

## 2021-06-06 NOTE — Progress Notes (Signed)
Name: Barbara Rivers   MRN: [id number]    DOB: Jul 14, 1956   Date:06/07/2021       Progress Note  Subjective  Chief Complaint  Follow Up  HPI  Diabetes type [id number] 06/12/2019 with A1C 6.7 %,  it has been stable, today is 6.6 % she has not been on any medications.  She has associated  dyslipidemia, she refused statin therapy, she denies polyphagia, polydipsia or polyuria . LDL not at goal , it was 137, goal is below 45 Mother has diabetes Urine micro negative, eye exam is up to date   Dyslipidemia: she refuses statin therapy. Explained risk of heart attacks and strokes, discussed risk below   The 10-year ASCVD risk score Mikey Bussing DC Brooke Bonito., et al., 2013) is: 8.3%   Values used to calculate the score:     Age: 65 years     Sex: Female     Is Non-Hispanic African American: No     Diabetic: Yes     Tobacco smoker: No     Systolic Blood Pressure: 123XX123 mmHg     Is BP treated: No     HDL Cholesterol: 76 mg/dL     Total Cholesterol: 240 mg/dL   Right knee pain / chronic: seen by Ortho last Fall had synvisc, Still has massage therapy , she states swimming helps with pain and mobility .   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/31/2016   Muscle wasting and atrophy, not elsewhere classified, right thigh 05/31/2016    Past Surgical History:  Procedure Laterality Date   CERVICAL CONIZATION W/BX     KNEE ARTHROSCOPY  02/23/2012   Procedure: ARTHROSCOPY KNEE;  Surgeon: Alta Corning, MD;  Location: [facility];  Service: Orthopedics;  Laterality: Right;  Lateral Retinacular  Release, Chondroplasty Medial Femoral Condyle and Patella, Partial Lateral Meniscectomy   QUADRICEPS TENDON REPAIR  11/15/2012   Procedure: REPAIR QUADRICEP TENDON;  Surgeon: Alta Corning, MD;  Location: [facility];  Service: Orthopedics;  Laterality: Right;  WITH ALLOGRAFT   TONSILLECTOMY      Family History  Problem Relation Age of Onset   Diabetes Mother    Hypertension Mother    Congestive Heart Failure Mother    Diabetes Father    Kidney disease Father    Hypertension Father    Cancer Maternal Grandmother 14       Breast   Breast cancer Maternal Grandmother 80   Diabetes Paternal Grandmother     Social History   Tobacco Use   Smoking status: Never   Smokeless tobacco: Never  Substance Use Topics   Alcohol use: Yes    Alcohol/week: 1.0 standard drink    Types: 1 Glasses of wine per week    Comment: rarely     Current Outpatient Medications:    diclofenac Sodium (VOLTAREN) 1 % GEL, Apply 4 g topically 4 (four) times daily., Disp: 300 g, Rfl: 1   Glucos-Chondroit-Collag-Hyal (JOINT SUPPORT FORMULA PO), Take by mouth., Disp: , Rfl:    Multiple Vitamin (MULTIVITAMIN) tablet,  Take 1 tablet by mouth daily., Disp: , Rfl:    baclofen (LIORESAL) 10 MG tablet, Take 1 tablet (10 mg total) by mouth 3 (three) times daily as needed for muscle spasms., Disp: 90 tablet, Rfl: 1  Allergies  Allergen Reactions   Penicillins Hives    I personally reviewed active problem list, medication list, allergies, family history, social history, health maintenance with the patient/caregiver today.   ROS  Constitutional: Negative for fever or weight change.  Respiratory: Negative for cough and shortness of breath.   Cardiovascular: Negative for chest pain or palpitations.  Gastrointestinal: Negative for abdominal pain, no bowel changes.  Musculoskeletal: Positive for gait problem  and right knee decrease of rom and uses a cane to assist on her gait  Skin: Negative for rash.   Neurological: Negative for dizziness or headache.  No other specific complaints in a complete review of systems (except as listed in HPI above).   Objective  Vitals:   06/07/21 0935  BP: 122/84  Pulse: 83  Resp: 16  Temp: 98.1 F (36.7 C)  SpO2: 99%  Weight: 199 lb (90.3 kg)  Height: '5\' 4"'$  (1.626 m)    Body mass index is 34.16 kg/m.  Physical Exam  Constitutional: Patient appears well-developed and well-nourished. Obese  No distress.  HEENT: head atraumatic, normocephalic, pupils equal and reactive to light, neck supple Cardiovascular: Normal rate, regular rhythm and normal heart sounds.  No murmur heard. No BLE edema. Pulmonary/Chest: Effort normal and breath sounds normal. No respiratory distress. Abdominal: Soft.  There is no tenderness. Muscular Skeletal: uses a cane, atrophy of quad muscle, decrease rom of right knee  Psychiatric: Patient has a normal mood and affect. behavior is normal. Judgment and thought content normal.   Diabetic Foot Exam: Diabetic Foot Exam - Simple   Simple Foot Form Diabetic Foot exam was performed with the following findings: Yes 06/07/2021 10:13 AM  Visual Inspection No deformities, no ulcerations, no other skin breakdown bilaterally: Yes Sensation Testing Intact to touch and monofilament testing bilaterally: Yes Pulse Check Posterior Tibialis and Dorsalis pulse intact bilaterally: Yes Comments      PHQ2/9: Depression screen [facility] 2/9 06/07/2021 12/07/2020 11/11/2020 03/15/2020 09/16/2019  Decreased Interest 0 0 0 0 0  Down, Depressed, Hopeless 0 0 0 1 0  PHQ - 2 Score 0 0 0 1 0  Altered sleeping - - - 1 1  Tired, decreased energy - - - 1 1  Change in appetite - - - 0 0  Feeling bad or failure about yourself  - - - 0 1  Trouble concentrating - - - 0 0  Moving slowly or fidgety/restless - - - 0 1  Suicidal thoughts - - - 0 0  PHQ-9 Score - - - 3 4  Difficult doing work/chores - - - Not difficult at all Not difficult at all  Some recent  data might be hidden    phq 9 is negative   Fall Risk: Fall Risk  06/07/2021 12/07/2020 11/11/2020 03/15/2020 09/16/2019  Falls in the past year? 0 0 0 0 0  Number falls in past yr: 0 0 0 0 0  Injury with Fall? 0 0 0 0 0  Risk Factor Category  - - - - -  Risk for fall due to : - - Impaired balance/gait;Impaired mobility - -  Follow up - - Falls prevention discussed - -      Functional Status Survey: Is the patient deaf or have difficulty hearing?: No  Does the patient have difficulty seeing, even when wearing glasses/contacts?: No Does the patient have difficulty concentrating, remembering, or making decisions?: No Does the patient have difficulty walking or climbing stairs?: Yes Does the patient have difficulty dressing or bathing?: No Does the patient have difficulty doing errands alone such as visiting a doctor's office or shopping?: Yes    Assessment & Plan  1. Dyslipidemia due to type 2 diabetes mellitus (HCC)  - POCT HgB A1C  2. Dyslipidemia   3. Chronic pain of right knee  - baclofen (LIORESAL) 10 MG tablet; Take 1 tablet (10 mg total) by mouth 3 (three) times daily as needed for muscle spasms.  Dispense: 90 tablet; Refill: 1  4. Spasm  - baclofen (LIORESAL) 10 MG tablet; Take 1 tablet (10 mg total) by mouth 3 (three) times daily as needed for muscle spasms.  Dispense: 90 tablet; Refill: 1  5. Muscle wasting and atrophy, not elsewhere classified, right thigh  - baclofen (LIORESAL) 10 MG tablet; Take 1 tablet (10 mg total) by mouth 3 (three) times daily as needed for muscle spasms.  Dispense: 90 tablet; Refill: 1  6. Need for Tdap vaccination  - Tdap vaccine greater than or equal to 7yo IM  7. Need for pneumococcal vaccine  - Pneumococcal conjugate vaccine 20-valent (Prevnar 20)  8. Excoriation of left lower leg, initial encounter

## 2021-06-07 ENCOUNTER — Ambulatory Visit (INDEPENDENT_AMBULATORY_CARE_PROVIDER_SITE_OTHER): Payer: Medicare HMO | Admitting: Family Medicine

## 2021-06-07 ENCOUNTER — Encounter: Payer: Self-pay | Admitting: Family Medicine

## 2021-06-07 ENCOUNTER — Other Ambulatory Visit: Payer: Self-pay

## 2021-06-07 VITALS — BP 122/84 | HR 83 | Temp 98.1°F | Resp 16 | Ht 64.0 in | Wt 199.0 lb

## 2021-06-07 DIAGNOSIS — E785 Hyperlipidemia, unspecified: Secondary | ICD-10-CM

## 2021-06-07 DIAGNOSIS — M25561 Pain in right knee: Secondary | ICD-10-CM | POA: Diagnosis not present

## 2021-06-07 DIAGNOSIS — E1169 Type 2 diabetes mellitus with other specified complication: Secondary | ICD-10-CM

## 2021-06-07 DIAGNOSIS — M62551 Muscle wasting and atrophy, not elsewhere classified, right thigh: Secondary | ICD-10-CM | POA: Diagnosis not present

## 2021-06-07 DIAGNOSIS — S80812A Abrasion, left lower leg, initial encounter: Secondary | ICD-10-CM

## 2021-06-07 DIAGNOSIS — Z23 Encounter for immunization: Secondary | ICD-10-CM

## 2021-06-07 DIAGNOSIS — G8929 Other chronic pain: Secondary | ICD-10-CM | POA: Diagnosis not present

## 2021-06-07 DIAGNOSIS — R252 Cramp and spasm: Secondary | ICD-10-CM | POA: Diagnosis not present

## 2021-06-07 DIAGNOSIS — Z79899 Other long term (current) drug therapy: Secondary | ICD-10-CM

## 2021-06-07 LAB — POCT GLYCOSYLATED HEMOGLOBIN (HGB A1C): Hemoglobin A1C: 6.6 % — AB (ref 4.0–5.6)

## 2021-06-07 MED ORDER — BACLOFEN 10 MG PO TABS: 10.0000 mg | ORAL_TABLET | Freq: Three times a day (TID) | ORAL | 1 refills | Status: DC | PRN

## 2021-06-13 ENCOUNTER — Telehealth: Payer: Self-pay | Admitting: *Deleted

## 2021-06-13 NOTE — Chronic Care Management (AMB) (Signed)
  Chronic Care Management   Outreach Note  06/13/2021 Name: Barbara Rivers MRN: [id number] DOB: [birth year]  Barbara Rivers is a 65 y.o. year old female who is a primary care patient of Steele Sizer, MD. I reached out to Barbara Rivers by phone today in response to a referral sent by Barbara Rivers's PCP  Steele Sizer, MD     An unsuccessful telephone outreach was attempted today. The patient was referred to the case management team for assistance with care management and care coordination.   Follow Up Plan: A HIPAA compliant phone message was left for the patient providing contact information and requesting a return call.  If patient returns call to provider office, please advise to call Embedded Care Management Care Guide Natalie Leclaire at [place], [place] [facility]  Direct Dial: [phone]

## 2021-06-21 NOTE — Chronic Care Management (AMB) (Signed)
  Chronic Care Management   Outreach Note  06/21/2021 Name: SHAMRA SROUFE MRN: [id number] DOB: 03/23/1956  JANAJA ALCANTARA is a 65 y.o. year old female who is a primary care patient of Steele Sizer, MD. I reached out to [name] by phone today in response to a referral sent by Ms. Mauri Reading Levi's PCP Steele Sizer, MD     A second unsuccessful telephone outreach was attempted today. The patient was referred to the case management team for assistance with care management and care coordination.   Follow Up Plan: A HIPAA compliant phone message was left for the patient providing contact information and requesting a return call.  If patient returns call to provider office, please advise to call Embedded Care Management Care Guide Stepehn Eckard at [place], [place] [facility]  Direct Dial: [phone]

## 2021-06-21 NOTE — Chronic Care Management (AMB) (Signed)
  Chronic Care Management   Note  06/21/2021 Name: VAISHNAVI DALBY MRN: [id number] DOB: 1956-08-17  ELENOR WILDES is a 65 y.o. year old female who is a primary care patient of Steele Sizer, MD. I reached out to [name] by phone today in response to a referral sent by Ms. Mauri Reading Shaul's PCP  Steele Sizer, MD     Ms. Strzelecki was given information about Chronic Care Management services today including:  CCM service includes personalized support from designated clinical staff supervised by her physician, including individualized plan of care and coordination with other care providers 24/7 contact phone numbers for assistance for urgent and routine care needs. Service will only be billed when office clinical staff spend 20 minutes or more in a month to coordinate care. Only one practitioner may furnish and bill the service in a calendar month. The patient may stop CCM services at any time (effective at the end of the month) by phone call to the office staff. The patient will be responsible for cost sharing (co-pay) of up to 20% of the service fee (after annual deductible is met).  Patient agreed to services and verbal consent obtained.   Follow up plan: Telephone appointment with care management team member scheduled for: 07/14/2021  Julian Hy, [place] [facility]  Direct Dial: [phone]

## 2021-07-14 ENCOUNTER — Ambulatory Visit (INDEPENDENT_AMBULATORY_CARE_PROVIDER_SITE_OTHER): Payer: Medicare HMO

## 2021-07-14 DIAGNOSIS — E1169 Type 2 diabetes mellitus with other specified complication: Secondary | ICD-10-CM

## 2021-07-14 DIAGNOSIS — E785 Hyperlipidemia, unspecified: Secondary | ICD-10-CM

## 2021-07-14 NOTE — Chronic Care Management (AMB) (Signed)
Chronic Care Management   CCM RN Visit Note  07/14/2021 Name: Barbara Rivers MRN: [id number] DOB: 02/23/1956  Subjective: Barbara Rivers is a 65 y.o. year old female who is a primary care patient of Steele Sizer, MD. The care management team was consulted for assistance with disease management and care coordination needs.    Engaged with patient by telephone for initial visit in response to provider referral for case management andcare coordination services.   Consent to Services:  The patient was given the following information about Chronic Care Management services: 1. CCM service includes personalized support from designated clinical staff supervised by the primary care provider, including individualized plan of care and coordination with other care providers 2. 24/7 contact phone numbers for assistance for urgent and routine care needs. 3. Service will only be billed when office clinical staff spend 20 minutes or more in a month to coordinate care. 4. Only one practitioner may furnish and bill the service in a calendar month. 5.The patient may stop CCM services at any time (effective at the end of the month) by phone call to the office staff. 6. The patient will be responsible for cost sharing (co-pay) of up to 20% of the service fee (after annual deductible is met). Patient agreed to services and consent obtained.   Assessment: Review of patient past medical history, allergies, medications, health status, including review of consultants reports, laboratory and other test data, was performed as part of comprehensive evaluation and provision of chronic care management services.   SDOH (Social Determinants of Health) assessments and interventions performed:  SDOH Interventions    Flowsheet Row Most Recent Value  SDOH Interventions   Food Insecurity Interventions Intervention Not Indicated  Transportation Interventions Intervention Not Indicated        CCM Care Plan  Allergies   Allergen Reactions   Penicillins Hives    Outpatient Encounter Medications as of 07/14/2021  Medication Sig   baclofen (LIORESAL) 10 MG tablet Take 1 tablet (10 mg total) by mouth 3 (three) times daily as needed for muscle spasms.   diclofenac Sodium (VOLTAREN) 1 % GEL Apply 4 g topically 4 (four) times daily.   Glucos-Chondroit-Collag-Hyal (JOINT SUPPORT FORMULA PO) Take by mouth.   Multiple Vitamin (MULTIVITAMIN) tablet Take 1 tablet by mouth daily.   No facility-administered encounter medications on file as of 07/14/2021.    Patient Active Problem List   Diagnosis Date Noted   VV (varicose veins) 07/26/2016   Venous insufficiency of right leg 07/05/2016   Obesity (BMI 30.0-34.9) 07/05/2016   Elevated blood pressure 07/05/2016   Right knee pain 05/31/2016   Primary osteoarthritis of right knee 05/31/2016   Quadriceps tendon rupture 05/31/2016   CRPS type II 05/31/2016   Muscle wasting and atrophy, not elsewhere classified, right thigh 05/31/2016    Conditions addressed:HLD and DMII  Mrs. Kotter reports excellent compliance with medications and treatment plans for Hyperlipidemia and Diabetes. Reports diabetes is currently managed with diet and exercise. Her activity tolerance has decreased due to a work related leg injury. She reports being able to engage in low impact activities such as pedaling and pool exercises. Reports good nutritional intake and attempting to maintain a heart healthy/diabetic diet. We thoroughly discussed several food options to maintain heart health and optimize glycemic control. Declined current need for additional referrals. Agreeable to receiving additional information related to nutrition. She agreed to call if additional outreach is required. The care management team will gladly assist.   PLAN Mrs.  Rivers will contact the clinic if additional outreach is required. The care management team will gladly assist.   [name] [facility] Care  Management [facility] [phone]

## 2021-08-05 DIAGNOSIS — E785 Hyperlipidemia, unspecified: Secondary | ICD-10-CM

## 2021-08-05 DIAGNOSIS — E1169 Type 2 diabetes mellitus with other specified complication: Secondary | ICD-10-CM | POA: Diagnosis not present

## 2021-09-12 NOTE — Progress Notes (Signed)
Name: Barbara Rivers   MRN: [id number]    DOB: 1956-06-19   Date:09/13/2021       Progress Note  Subjective  Chief Complaint  Knee  HPI    Work related injury/chronic right knee pain/CRPS: she is still having daily pain on right knee, decrease rom , uses a cane for ambulation and when groceries shopping has to use the store cart to help her work,. She takes Ibuprofen prn, cannot take high dose because it causes upset stomach. Canot tolerate Lyrica or gabapentin She is using CBD gel and also having frequent massage therapy to help with pain  . She has atrophy of thigh and right leg weakness. She also has post-traumatic OA she had synvisc injection, unable to tolerate Meloxicam and is back on Ibuprofen. She has to be more active helping her husband that had a stroke and pain is more intense lately, we discussed Tylenol arthritis to take up to 3 g daily.  Unable to work secondary to pain and has social security disability. Injury happened 07/2011, settlement 2018. Surgery was in 2013 and quadriceps tendon repair 2014 after a rupture. She states topical medication helps with pain.  She states Baclofen helps with muscle spasm of her right quad. She is using Newstep at home and feels like it is helping with her hamstring strength, but unable to use for more than 10 minutes each time  if knee is swollen. She states water activity helps a lot with her symptoms but her pool water is too cold at this time of the year.  She states right now pain is 7/10.    Patient Active Problem List   Diagnosis Date Noted   VV (varicose veins) 07/26/2016   Venous insufficiency of right leg 07/05/2016   Obesity (BMI 30.0-34.9) 07/05/2016   Elevated blood pressure 07/05/2016   Right knee pain 05/31/2016   Primary osteoarthritis of right knee 05/31/2016   Quadriceps tendon rupture 05/31/2016   CRPS type II 05/31/2016   Muscle wasting and atrophy, not elsewhere classified, right thigh 05/31/2016    Past Surgical  History:  Procedure Laterality Date   CERVICAL CONIZATION W/BX     KNEE ARTHROSCOPY  02/23/2012   Procedure: ARTHROSCOPY KNEE;  Surgeon: Alta Corning, MD;  Location: [place];  Service: Orthopedics;  Laterality: Right;  Lateral Retinacular Release, Chondroplasty Medial Femoral Condyle and Patella, Partial Lateral Meniscectomy   QUADRICEPS TENDON REPAIR  11/15/2012   Procedure: REPAIR QUADRICEP TENDON;  Surgeon: Alta Corning, MD;  Location: [place];  Service: Orthopedics;  Laterality: Right;  WITH ALLOGRAFT   TONSILLECTOMY      Family History  Problem Relation Age of Onset   Diabetes Mother    Hypertension Mother    Congestive Heart Failure Mother    Diabetes Father    Kidney disease Father    Hypertension Father    Cancer Maternal Grandmother 58       Breast   Breast cancer Maternal Grandmother 80   Diabetes Paternal Grandmother     Social History   Tobacco Use   Smoking status: Never   Smokeless tobacco: Never  Substance Use Topics   Alcohol use: Yes    Alcohol/week: 1.0 standard drink    Types: 1 Glasses of wine per week    Comment: rarely     Current Outpatient Medications:    baclofen (LIORESAL) 10 MG tablet, Take 1 tablet (10 mg total) by mouth 3 (three) times daily as needed  for muscle spasms., Disp: 90 tablet, Rfl: 1   diclofenac Sodium (VOLTAREN) 1 % GEL, Apply 4 g topically 4 (four) times daily., Disp: 300 g, Rfl: 1   Glucos-Chondroit-Collag-Hyal (JOINT SUPPORT FORMULA PO), Take by mouth., Disp: , Rfl:    ibuprofen (ADVIL) 200 MG tablet, Take 200 mg by mouth every 6 (six) hours as needed., Disp: , Rfl:    Multiple Vitamin (MULTIVITAMIN) tablet, Take 1 tablet by mouth daily., Disp: , Rfl:   Allergies  Allergen Reactions   Penicillins Hives    I personally reviewed active problem list, medication list, allergies, family history, social history, health maintenance with the patient/caregiver today.   ROS  Constitutional:  Negative for fever or weight change.  Respiratory: Negative for cough and shortness of breath.   Cardiovascular: Negative for chest pain or palpitations.  Gastrointestinal: Negative for abdominal pain, no bowel changes.  Musculoskeletal: Positive  for gait problem and right knee joint swelling.  Skin: Negative for rash.  Neurological: Negative for dizziness or headache.  No other specific complaints in a complete review of systems (except as listed in HPI above).   Objective  Vitals:   09/13/21 1042  BP: 132/70  Pulse: 88  Resp: 16  Temp: 98.5 F (36.9 C)  SpO2: 98%  Weight: 199 lb (90.3 kg)  Height: 5\' 4"  (1.626 m)    Body mass index is 34.16 kg/m.  Physical Exam  Constitutional: Patient appears well-developed and well-nourished. Obese  No distress.  HEENT: head atraumatic, normocephalic, pupils equal and reactive to light, neck supple Cardiovascular: Normal rate, regular rhythm and normal heart sounds.  No murmur heard. No BLE edema. Pulmonary/Chest: Effort normal and breath sounds normal. No respiratory distress. Abdominal: Soft.  There is no tenderness. Muscular Skeletal: atrophy on right inner thigh, also has decrease rom of right knee, uses a cane to assist with gait  Psychiatric: Patient has a normal mood and affect. behavior is normal. Judgment and thought content normal.   PHQ2/9: Depression screen [facility] 2/9 09/13/2021 07/14/2021 06/07/2021 12/07/2020 11/11/2020  Decreased Interest 0 0 0 0 0  Down, Depressed, Hopeless 1 0 0 0 0  PHQ - 2 Score 1 0 0 0 0  Altered sleeping 1 - - - -  Tired, decreased energy 1 - - - -  Change in appetite 0 - - - -  Feeling bad or failure about yourself  0 - - - -  Trouble concentrating 0 - - - -  Moving slowly or fidgety/restless 0 - - - -  Suicidal thoughts 0 - - - -  PHQ-9 Score 3 - - - -  Difficult doing work/chores - - - - -  Some recent data might be hidden    phq 9 is positive   Fall Risk: Fall Risk  09/13/2021 07/14/2021 06/07/2021  12/07/2020 11/11/2020  Falls in the past year? 0 0 0 0 0  Number falls in past yr: 0 0 0 0 0  Injury with Fall? 0 - 0 0 0  Risk Factor Category  - - - - -  Risk for fall due to : No Fall Risks Impaired balance/gait - - Impaired balance/gait;Impaired mobility  Follow up Falls prevention discussed Falls prevention discussed - - Falls prevention discussed      Functional Status Survey: Is the patient deaf or have difficulty hearing?: No Does the patient have difficulty seeing, even when wearing glasses/contacts?: No Does the patient have difficulty concentrating, remembering, or making decisions?: No Does the patient  have difficulty walking or climbing stairs?: Yes Does the patient have difficulty dressing or bathing?: Yes Does the patient have difficulty doing errands alone such as visiting a doctor's office or shopping?: Yes    Assessment & Plan  1. Chronic pain of right knee  - diclofenac Sodium (VOLTAREN) 1 % GEL; Apply 4 g topically 4 (four) times daily.  Dispense: 300 g; Refill: 1 - baclofen (LIORESAL) 10 MG tablet; Take 1 tablet (10 mg total) by mouth 3 (three) times daily as needed for muscle spasms.  Dispense: 90 tablet; Refill: 1 - traMADol (ULTRAM) 50 MG tablet; Take 1 tablet (50 mg total) by mouth every 8 (eight) hours as needed for up to 5 days.  Dispense: 15 tablet; Refill: 1  She asked for some pain medication to take prn severe pain   2. Muscle wasting and atrophy, not elsewhere classified, right thigh  - baclofen (LIORESAL) 10 MG tablet; Take 1 tablet (10 mg total) by mouth 3 (three) times daily as needed for muscle spasms.  Dispense: 90 tablet; Refill: 1  3. Spasm  - baclofen (LIORESAL) 10 MG tablet; Take 1 tablet (10 mg total) by mouth 3 (three) times daily as needed for muscle spasms.  Dispense: 90 tablet; Refill: 1  4. Post-traumatic osteoarthritis of right knee  - acetaminophen (TYLENOL) 500 MG tablet; Take 1 tablet (500 mg total) by mouth every 6 (six) hours  as needed.  Dispense: 120 tablet; Refill: 5

## 2021-09-13 ENCOUNTER — Encounter: Payer: Self-pay | Admitting: Family Medicine

## 2021-09-13 ENCOUNTER — Other Ambulatory Visit: Payer: Self-pay

## 2021-09-13 ENCOUNTER — Ambulatory Visit: Payer: Self-pay | Admitting: Family Medicine

## 2021-09-13 VITALS — BP 132/70 | HR 88 | Temp 98.5°F | Resp 16 | Ht 64.0 in | Wt 199.0 lb

## 2021-09-13 DIAGNOSIS — G8929 Other chronic pain: Secondary | ICD-10-CM

## 2021-09-13 DIAGNOSIS — M1731 Unilateral post-traumatic osteoarthritis, right knee: Secondary | ICD-10-CM

## 2021-09-13 DIAGNOSIS — R252 Cramp and spasm: Secondary | ICD-10-CM

## 2021-09-13 DIAGNOSIS — M62551 Muscle wasting and atrophy, not elsewhere classified, right thigh: Secondary | ICD-10-CM

## 2021-09-13 DIAGNOSIS — M25561 Pain in right knee: Secondary | ICD-10-CM

## 2021-09-13 MED ORDER — TRAMADOL HCL 50 MG PO TABS: 50.0000 mg | ORAL_TABLET | Freq: Three times a day (TID) | ORAL | 1 refills | Status: AC | PRN

## 2021-09-13 MED ORDER — ACETAMINOPHEN 500 MG PO TABS: 500.0000 mg | ORAL_TABLET | Freq: Four times a day (QID) | ORAL | 5 refills | Status: DC | PRN

## 2021-09-13 MED ORDER — DICLOFENAC SODIUM 1 % EX GEL: 4.0000 g | Freq: Four times a day (QID) | CUTANEOUS | 1 refills | Status: DC

## 2021-09-13 MED ORDER — BACLOFEN 10 MG PO TABS: 10.0000 mg | ORAL_TABLET | Freq: Three times a day (TID) | ORAL | 1 refills | Status: DC | PRN

## 2021-11-15 ENCOUNTER — Ambulatory Visit: Payer: PPO

## 2021-12-12 DIAGNOSIS — M7711 Lateral epicondylitis, right elbow: Secondary | ICD-10-CM | POA: Diagnosis not present

## 2021-12-27 ENCOUNTER — Ambulatory Visit (INDEPENDENT_AMBULATORY_CARE_PROVIDER_SITE_OTHER): Payer: Medicare HMO

## 2021-12-27 DIAGNOSIS — Z0000 Encounter for general adult medical examination without abnormal findings: Secondary | ICD-10-CM | POA: Diagnosis not present

## 2021-12-27 DIAGNOSIS — Z780 Asymptomatic menopausal state: Secondary | ICD-10-CM

## 2021-12-27 NOTE — Progress Notes (Signed)
Subjective:   Barbara Rivers is a 66 y.o. female who presents for Medicare Annual (Subsequent) preventive examination.  Virtual Visit via Telephone Note  I connected with  Barbara Rivers on 12/27/21 at  1:30 PM EST by telephone and verified that I am speaking with the correct person using two identifiers.  Location: Patient: home Provider: [facility] Persons participating in the virtual visit: Barbara Rivers   I discussed the limitations, risks, security and privacy concerns of performing an evaluation and management service by telephone and the availability of in person appointments. The patient expressed understanding and agreed to proceed.  Interactive audio and video telecommunications were attempted between this nurse and patient, however failed, due to patient having technical difficulties OR patient did not have access to video capability.  We continued and completed visit with audio only.  Some vital signs may be absent or patient reported.   Barbara Marker, LPN   Review of Systems     Cardiac Risk Factors include: obesity (BMI >30kg/m2)     Objective:    Today's Vitals   12/27/21 1329  PainSc: 7    There is no height or weight on file to calculate BMI.  Advanced Directives 12/27/2021 11/11/2020 06/17/2019 07/30/2017 09/18/2016 07/05/2016 11/12/2012  Does Patient Have a Medical Advance Directive? Yes Yes Yes No No No Patient has advance directive, copy not in chart  Type of Advance Directive [place];Living will [place];Living will [place];Living will - - - Living will;Healthcare Power of [place] in Chart? No - copy requested No - copy requested No - copy requested - - - -  Would patient like information on creating a medical advance directive? - - - No - Patient declined No - patient declined information No - patient declined information -    Current Medications  (verified) Outpatient Encounter Medications as of 12/27/2021  Medication Sig   baclofen (LIORESAL) 10 MG tablet Take 1 tablet (10 mg total) by mouth 3 (three) times daily as needed for muscle spasms.   diclofenac Sodium (VOLTAREN) 1 % GEL Apply 4 g topically 4 (four) times daily.   Glucos-Chondroit-Collag-Hyal (JOINT SUPPORT FORMULA PO) Take by mouth.   ibuprofen (ADVIL) 200 MG tablet Take 200 mg by mouth every 6 (six) hours as needed.   Multiple Vitamin (MULTIVITAMIN) tablet Take 1 tablet by mouth daily.   [DISCONTINUED] acetaminophen (TYLENOL) 500 MG tablet Take 1 tablet (500 mg total) by mouth every 6 (six) hours as needed.   No facility-administered encounter medications on file as of 12/27/2021.    Allergies (verified) Penicillins   History: Past Medical History:  Diagnosis Date   Arthritis    right knee   Disorder of jaw    occ. becomes tight and locks with dental procedures   Immature cataract    left eye   Quadriceps tendon rupture 11/2012   right   Past Surgical History:  Procedure Laterality Date   CERVICAL CONIZATION W/BX     KNEE ARTHROSCOPY  02/23/2012   Procedure: ARTHROSCOPY KNEE;  Surgeon: Barbara Corning, MD;  Location: [place];  Service: Orthopedics;  Laterality: Right;  Lateral Retinacular Release, Chondroplasty Medial Femoral Condyle and Patella, Partial Lateral Meniscectomy   QUADRICEPS TENDON REPAIR  11/15/2012   Procedure: REPAIR QUADRICEP TENDON;  Surgeon: Barbara Corning, MD;  Location: [place];  Service: Orthopedics;  Laterality: Right;  WITH ALLOGRAFT  TONSILLECTOMY     Family History  Problem Relation Age of Onset   Diabetes Mother    Hypertension Mother    Congestive Heart Failure Mother    Diabetes Father    Kidney disease Father    Hypertension Father    Cancer Maternal Grandmother 53       Breast   Breast cancer Maternal Grandmother 15   Diabetes Paternal Grandmother    Social History   Socioeconomic  History   Marital status: Married    Spouse name: Barbara Rivers   Number of children: 1   Years of education: Not on file   Highest education level: Not on file  Occupational History   Occupation: disabled    Comment: workman's comp   Tobacco Use   Smoking status: Never   Smokeless tobacco: Never  Vaping Use   Vaping Use: Never used  Substance and Sexual Activity   Alcohol use: Yes    Alcohol/week: 1.0 standard drink    Types: 1 Glasses of wine per week    Comment: rarely   Drug use: No   Sexual activity: Never  Other Topics Concern   Not on file  Social History Narrative   She used to work but since 2013 , had a injury at work and one year later they found out that she had quad surgery, she still has weakness , daily pain and uses a cane. Fully disabled.    Husband also had a stroke in 2015  and works from home now    Social Determinants of Radio broadcast assistant Strain: Low Risk    Difficulty of Paying Living Expenses: Not hard at all  Food Insecurity: No Food Insecurity   Worried About Charity fundraiser in the Last Year: Never true   Arboriculturist in the Last Year: Never true  Transportation Needs: No Transportation Needs   Lack of Transportation (Medical): No   Lack of Transportation (Non-Medical): No  Physical Activity: Insufficiently Active   Days of Exercise per Week: 3 days   Minutes of Exercise per Session: 20 min  Stress: No Stress Concern Present   Feeling of Stress : Only a little  Social Connections: Moderately Integrated   Frequency of Communication with Friends and Family: More than three times a week   Frequency of Social Gatherings with Friends and Family: More than three times a week   Attends Religious Services: More than 4 times per year   Active Member of [employer] or Organizations: No   Attends Music therapist: Never   Marital Status: Married    Tobacco Counseling Counseling given: Not Answered   Clinical Intake:  Pre-visit  preparation completed: Yes  Pain : 0-10 Pain Score: 7  Pain Type: Chronic pain Pain Location: Knee Pain Orientation: Right Pain Descriptors / Indicators: Aching, Sore Pain Onset: More than a month ago Pain Frequency: Constant     Nutritional Risks: None Diabetes: No  How often do you need to have someone help you when you read instructions, pamphlets, or other written materials from your doctor or pharmacy?: 1 - Never    Interpreter Needed?: No  Information entered by :: Barbara Marker LPN   Activities of Daily Living In your present state of health, do you have any difficulty performing the following activities: 12/27/2021 09/13/2021  Hearing? N N  Vision? N N  Difficulty concentrating or making decisions? N N  Walking or climbing stairs? Y Y  Dressing or bathing?  N Y  Doing errands, shopping? N Y  Conservation officer, nature and eating ? N -  Using the Toilet? N -  In the past six months, have you accidently leaked urine? N -  Do you have problems with loss of bowel control? N -  Managing your Medications? N -  Managing your Finances? N -  Housekeeping or managing your Housekeeping? N -  Some recent data might be hidden    Patient Care Team: Steele Sizer, MD as PCP - General (Family Medicine) Leandrew Koyanagi, MD as Referring Physician (Ophthalmology)  Indicate any recent Medical Services you may have received from other than [facility] providers in the past year (date may be approximate).     Assessment:   This is a routine wellness examination for [place].  Hearing/Vision screen Hearing Screening - Comments:: Pt denies hearing difficulty Vision Screening - Comments:: Annual vision screenings done at [facility] issues and exercise activities discussed: Current Exercise Habits: Home exercise routine, Type of exercise: Other - see comments (nustep), Time (Minutes): 20, Frequency (Times/Week): 3, Weekly Exercise (Minutes/Week): 60, Intensity: Moderate,  Exercise limited by: orthopedic condition(s)   Goals Addressed   None    Depression Screen PHQ 2/9 Scores 12/27/2021 09/13/2021 07/14/2021 06/07/2021 12/07/2020 11/11/2020 03/15/2020  PHQ - 2 Score 0 1 0 0 0 0 1  PHQ- 9 Score - 3 - - - - 3    Fall Risk Fall Risk  12/27/2021 09/13/2021 07/14/2021 06/07/2021 12/07/2020  Falls in the past year? 0 0 0 0 0  Number falls in past yr: 0 0 0 0 0  Injury with Fall? 0 0 - 0 0  Risk Factor Category  - - - - -  Risk for fall due to : No Fall Risks No Fall Risks Impaired balance/gait - -  Follow up Falls prevention discussed Falls prevention discussed Falls prevention discussed - -    FALL RISK PREVENTION PERTAINING TO THE HOME:  Any stairs in or around the home? Yes  If so, are there any without handrails? No  Home free of loose throw rugs in walkways, pet beds, electrical cords, etc? Yes  Adequate lighting in your home to reduce risk of falls? Yes   ASSISTIVE DEVICES UTILIZED TO PREVENT FALLS:  Life alert? No  Use of a cane, walker or w/c? Yes  Grab bars in the bathroom? Yes  Shower chair or bench in shower? Yes  Elevated toilet seat or a handicapped toilet? No   TIMED UP AND GO:  Was the test performed? No . Telephonic visit.   Cognitive Function: Normal cognitive status assessed by direct observation by this Nurse Health Advisor. No abnormalities found.          Immunizations Immunization History  Administered Date(s) Administered   Influenza Inj Mdck Quad Pf 09/16/2019   Influenza,inj,Quad PF,6+ Mos 09/18/2016, 09/25/2017, 09/11/2018   PNEUMOCOCCAL CONJUGATE-20 06/07/2021   Tdap 06/07/2021   Zoster Recombinat (Shingrix) 03/25/2018, 08/22/2018    TDAP status: Up to date  Flu Vaccine status: Due, Education has been provided regarding the importance of this vaccine. Advised may receive this vaccine at local pharmacy or Health Dept. Aware to provide a copy of the vaccination record if obtained from local pharmacy or Health Dept. Verbalized  acceptance and understanding.  Pneumococcal vaccine status: Up to date  Covid-19 vaccine status: Declined, Education has been provided regarding the importance of this vaccine but patient still declined. Advised may receive this vaccine at local pharmacy or Health  Dept.or vaccine clinic. Aware to provide a copy of the vaccination record if obtained from local pharmacy or Health Dept. Verbalized acceptance and understanding.  Qualifies for Shingles Vaccine? Yes   Zostavax completed No   Shingrix Completed?: Yes  Screening Tests Health Maintenance  Topic Date Due   COVID-19 Vaccine (1) Never done   DEXA SCAN  10/11/2018   URINE MICROALBUMIN  12/07/2021   INFLUENZA VACCINE  02/03/2022 (Originally 06/06/2021)   HIV Screening  08/16/2029 (Originally 04/14/1971)   PAP SMEAR-Modifier  06/11/2022   Fecal DNA (Cologuard)  08/19/2022   MAMMOGRAM  12/28/2022   TETANUS/TDAP  06/08/2031   Hepatitis C Screening  Completed   Zoster Vaccines- Shingrix  Completed   HPV VACCINES  Aged Out    Health Maintenance  Health Maintenance Due  Topic Date Due   COVID-19 Vaccine (1) Never done   DEXA SCAN  10/11/2018   URINE MICROALBUMIN  12/07/2021    Colorectal cancer screening: Type of screening: Cologuard. Completed 08/20/19. Repeat every 3 years  Mammogram status: Completed 12/28/20. Repeat every year  Bone Density status: Completed 10/11/16. Results reflect: Bone density results: NORMAL. Repeat every 3-5 years. Ordered today.   Lung Cancer Screening: (Low Dose CT Chest recommended if Age 39-80 years, 30 pack-year currently smoking OR have quit w/in 15years.) does not qualify.   Additional Screening:  Hepatitis C Screening: does qualify; Completed 07/20/16  Vision Screening: Recommended annual ophthalmology exams for early detection of glaucoma and other disorders of the eye. Is the patient up to date with their annual eye exam?  Yes  Who is the provider or what is the name of the office in which  the patient attends annual eye exams? [facility].   Dental Screening: Recommended annual dental exams for proper oral hygiene  Community Resource Referral / Chronic Care Management: CRR required this visit?  No   CCM required this visit?  No      Plan:     I have personally reviewed and noted the following in the patients chart:   Medical and social history Use of alcohol, tobacco or illicit drugs  Current medications and supplements including opioid prescriptions.  Functional ability and status Nutritional status Physical activity Advanced directives List of other physicians Hospitalizations, surgeries, and ER visits in previous 12 months Vitals Screenings to include cognitive, depression, and falls Referrals and appointments  In addition, I have reviewed and discussed with patient certain preventive protocols, quality metrics, and best practice recommendations. A written personalized care plan for preventive services as well as general preventive health recommendations were provided to patient.     Barbara Marker, LPN   6/71/2458   Nurse Notes: none

## 2021-12-27 NOTE — Patient Instructions (Signed)
Barbara Rivers , Thank you for taking time to come for your Medicare Wellness Visit. I appreciate your ongoing commitment to your health goals. Please review the following plan we discussed and let me know if I can assist you in the future.   Screening recommendations/referrals: Colonoscopy: Cologuard done 08/20/19. Repeat 08/2022 Mammogram: done 12/28/20 Bone Density: done 10/11/16. Please call [phone] to schedule your bone density screening Recommended yearly ophthalmology/optometry visit for glaucoma screening and checkup Recommended yearly dental visit for hygiene and checkup  Vaccinations: Influenza vaccine: due Pneumococcal vaccine: done 06/07/21 Tdap vaccine: done 06/07/21 Shingles vaccine: done 03/15/18 & 08/22/18   Covid-19:declined  Advanced directives: Please bring a copy of your health care power of attorney and living will to the office at your convenience.   Conditions/risks identified: Keep up the great work!  Next appointment: Follow up in one year for your annual wellness visit    Preventive Care 65 Years and Older, Female Preventive care refers to lifestyle choices and visits with your health care provider that can promote health and wellness. What does preventive care include? A yearly physical exam. This is also called an annual well check. Dental exams once or twice a year. Routine eye exams. Ask your health care provider how often you should have your eyes checked. Personal lifestyle choices, including: Daily care of your teeth and gums. Regular physical activity. Eating a healthy diet. Avoiding tobacco and drug use. Limiting alcohol use. Practicing safe sex. Taking low-dose aspirin every day. Taking vitamin and mineral supplements as recommended by your health care provider. What happens during an annual well check? The services and screenings done by your health care provider during your annual well check will depend on your age, overall health, lifestyle  risk factors, and family history of disease. Counseling  Your health care provider may ask you questions about your: Alcohol use. Tobacco use. Drug use. Emotional well-being. Home and relationship well-being. Sexual activity. Eating habits. History of falls. Memory and ability to understand (cognition). Work and work Statistician. Reproductive health. Screening  You may have the following tests or measurements: Height, weight, and BMI. Blood pressure. Lipid and cholesterol levels. These may be checked every 5 years, or more frequently if you are over 37 years old. Skin check. Lung cancer screening. You may have this screening every year starting at age 75 if you have a 30-pack-year history of smoking and currently smoke or have quit within the past 15 years. Fecal occult blood test (FOBT) of the stool. You may have this test every year starting at age 44. Flexible sigmoidoscopy or colonoscopy. You may have a sigmoidoscopy every 5 years or a colonoscopy every 10 years starting at age 11. Hepatitis C blood test. Hepatitis B blood test. Sexually transmitted disease (STD) testing. Diabetes screening. This is done by checking your blood sugar (glucose) after you have not eaten for a while (fasting). You may have this done every 1-3 years. Bone density scan. This is done to screen for osteoporosis. You may have this done starting at age 60. Mammogram. This may be done every 1-2 years. Talk to your health care provider about how often you should have regular mammograms. Talk with your health care provider about your test results, treatment options, and if necessary, the need for more tests. Vaccines  Your health care provider may recommend certain vaccines, such as: Influenza vaccine. This is recommended every year. Tetanus, diphtheria, and acellular pertussis (Tdap, Td) vaccine. You may need a Td booster every 10 years.  Zoster vaccine. You may need this after age 4. Pneumococcal  13-valent conjugate (PCV13) vaccine. One dose is recommended after age 62. Pneumococcal polysaccharide (PPSV23) vaccine. One dose is recommended after age 76. Talk to your health care provider about which screenings and vaccines you need and how often you need them. This information is not intended to replace advice given to you by your health care provider. Make sure you discuss any questions you have with your health care provider. Document Released: 11/19/2015 Document Revised: 07/12/2016 Document Reviewed: 08/24/2015 Elsevier Interactive Patient Education  2017 [place] Prevention in the Home Falls can cause injuries. They can happen to people of all ages. There are many things you can do to make your home safe and to help prevent falls. What can I do on the outside of my home? Regularly fix the edges of walkways and driveways and fix any cracks. Remove anything that might make you trip as you walk through a door, such as a raised step or threshold. Trim any bushes or trees on the path to your home. Use bright outdoor lighting. Clear any walking paths of anything that might make someone trip, such as rocks or tools. Regularly check to see if handrails are loose or broken. Make sure that both sides of any steps have handrails. Any raised decks and porches should have guardrails on the edges. Have any leaves, snow, or ice cleared regularly. Use sand or salt on walking paths during winter. Clean up any spills in your garage right away. This includes oil or grease spills. What can I do in the bathroom? Use night lights. Install grab bars by the toilet and in the tub and shower. Do not use towel bars as grab bars. Use non-skid mats or decals in the tub or shower. If you need to sit down in the shower, use a plastic, non-slip stool. Keep the floor dry. Clean up any water that spills on the floor as soon as it happens. Remove soap buildup in the tub or shower regularly. Attach  bath mats securely with double-sided non-slip rug tape. Do not have throw rugs and other things on the floor that can make you trip. What can I do in the bedroom? Use night lights. Make sure that you have a light by your bed that is easy to reach. Do not use any sheets or blankets that are too big for your bed. They should not hang down onto the floor. Have a firm chair that has side arms. You can use this for support while you get dressed. Do not have throw rugs and other things on the floor that can make you trip. What can I do in the kitchen? Clean up any spills right away. Avoid walking on wet floors. Keep items that you use a lot in easy-to-reach places. If you need to reach something above you, use a strong step stool that has a grab bar. Keep electrical cords out of the way. Do not use floor polish or wax that makes floors slippery. If you must use wax, use non-skid floor wax. Do not have throw rugs and other things on the floor that can make you trip. What can I do with my stairs? Do not leave any items on the stairs. Make sure that there are handrails on both sides of the stairs and use them. Fix handrails that are broken or loose. Make sure that handrails are as long as the stairways. Check any carpeting to make sure that  it is firmly attached to the stairs. Fix any carpet that is loose or worn. Avoid having throw rugs at the top or bottom of the stairs. If you do have throw rugs, attach them to the floor with carpet tape. Make sure that you have a light switch at the top of the stairs and the bottom of the stairs. If you do not have them, ask someone to add them for you. What else can I do to help prevent falls? Wear shoes that: Do not have high heels. Have rubber bottoms. Are comfortable and fit you well. Are closed at the toe. Do not wear sandals. If you use a stepladder: Make sure that it is fully opened. Do not climb a closed stepladder. Make sure that both sides of the  stepladder are locked into place. Ask someone to hold it for you, if possible. Clearly mark and make sure that you can see: Any grab bars or handrails. First and last steps. Where the edge of each step is. Use tools that help you move around (mobility aids) if they are needed. These include: Canes. Walkers. Scooters. Crutches. Turn on the lights when you go into a dark area. Replace any light bulbs as soon as they burn out. Set up your furniture so you have a clear path. Avoid moving your furniture around. If any of your floors are uneven, fix them. If there are any pets around you, be aware of where they are. Review your medicines with your doctor. Some medicines can make you feel dizzy. This can increase your chance of falling. Ask your doctor what other things that you can do to help prevent falls. This information is not intended to replace advice given to you by your health care provider. Make sure you discuss any questions you have with your health care provider. Document Released: 08/19/2009 Document Revised: 03/30/2016 Document Reviewed: 11/27/2014 Elsevier Interactive Patient Education  2017 [employer].

## 2022-01-06 NOTE — Patient Instructions (Signed)

## 2022-01-06 NOTE — Progress Notes (Signed)
Name: Barbara Rivers   MRN: [id number]    DOB: 06-11-1956   Date:01/09/2022       Progress Note  Subjective  Chief Complaint  Annual Exam  HPI  Patient presents for annual CPE and follow up  Diabetes type [id number] 06/12/2019 with A1C 6.7 %,  it has been stable, last visit it was 6.6 % she has not been on any medications, currently on life style modification.  She has associated  dyslipidemia, but she refuses statin therapy, she denies polyphagia, polydipsia or polyuria . We will recheck labs today , she asked me about Metformin , and is willing to try it, discussed possible side effects    Dyslipidemia: she refuses statin therapy. Explained risk of heart attacks and strokes, discussed risk below . Discussed zetia but she would like to hold off for now    The 10-year ASCVD risk score (Arnett DK, et al., 2019) is: 9.3%   Values used to calculate the score:     Age: 66 years     Sex: Female     Is Non-Hispanic African American: No     Diabetic: Yes     Tobacco smoker: No     Systolic Blood Pressure: 381 mmHg     Is BP treated: No     HDL Cholesterol: 76 mg/dL     Total Cholesterol: 240 mg/dL    Right tennis elbow: Christmas tree feel on her right arm, she saw Ortho and is waring a brace, had steroid injection, improving gradually   Diet: balanced, avoiding sweets  Exercise: she swims in the Summer , she does home  PT on right knee    Flowsheet Row Clinical Support from 12/27/2021 in [facility]  AUDIT-C Score 1      Depression: Phq 9 is  negative Depression screen [facility] 2/9 01/09/2022 12/27/2021 09/13/2021 07/14/2021 06/07/2021  Decreased Interest 0 0 0 0 0  Down, Depressed, Hopeless 0 0 1 0 0  PHQ - 2 Score 0 0 1 0 0  Altered sleeping 0 - 1 - -  Tired, decreased energy 0 - 1 - -  Change in appetite 0 - 0 - -  Feeling bad or failure about yourself  0 - 0 - -  Trouble concentrating 0 - 0 - -  Moving slowly or fidgety/restless 0 - 0 - -  Suicidal thoughts 0 - 0  - -  PHQ-9 Score 0 - 3 - -  Difficult doing work/chores - - - - -  Some recent data might be hidden   Hypertension: BP Readings from Last 3 Encounters:  01/09/22 130/82  09/13/21 132/70  06/07/21 122/84   Obesity: Wt Readings from Last 3 Encounters:  01/09/22 198 lb (89.8 kg)  09/13/21 199 lb (90.3 kg)  06/07/21 199 lb (90.3 kg)   BMI Readings from Last 3 Encounters:  01/09/22 33.99 kg/m  09/13/21 34.16 kg/m  06/07/21 34.16 kg/m     Vaccines:   HPV: N/A Tdap: up to date Shingrix: up to date Pneumonia: up to date  Flu: Declined COVID-19: Declined   Hep C Screening: 07/20/16 STD testing and prevention (HIV/chl/gon/syphilis): N/A Intimate partner violence: negative, he did have a stroke and his personality has changed  Sexual History : not currently sexually active  Menstrual History/LMP/Abnormal Bleeding: s/p menopause  Discussed importance of follow up if any post-menopausal bleeding: discussed it today  Incontinence Symptoms: No.  Breast cancer:  - Last Mammogram: 12/28/20, sending order today  -  BRCA gene screening: N/A  Osteoporosis Prevention : Discussed high calcium and vitamin D supplementation, weight bearing exercises Bone density :no, order placed   Cervical cancer screening: 06/12/19  Skin cancer: Discussed monitoring for atypical lesions  Colorectal cancer: 08/20/19   Lung cancer:  Low Dose CT Chest recommended if Age 64-80 years, 20 pack-year currently smoking OR have quit w/in 15years. Patient does not qualify.   ECG: 09/18/16  Advanced Care Planning: A voluntary discussion about advance care planning including the explanation and discussion of advance directives.  Discussed health care proxy and Living will, and the patient was able to identify a health care proxy as husband .  Patient does  have living will or power of attorney of health care  Lipids: Lab Results  Component Value Date   CHOL 240 (H) 12/07/2020   CHOL 212 (H) 06/12/2019   CHOL  216 (H) 07/19/2016   Lab Results  Component Value Date   HDL 76 12/07/2020   HDL 70 06/12/2019   HDL 61 07/19/2016   Lab Results  Component Value Date   LDLCALC 137 (H) 12/07/2020   LDLCALC 121 (H) 06/12/2019   LDLCALC 123 07/19/2016   Lab Results  Component Value Date   TRIG 138 12/07/2020   TRIG 106 06/12/2019   TRIG 162 (H) 07/19/2016   Lab Results  Component Value Date   CHOLHDL 3.2 12/07/2020   CHOLHDL 3.0 06/12/2019   CHOLHDL 3.5 07/19/2016   No results found for: LDLDIRECT  Glucose: Glucose, Bld  Date Value Ref Range Status  12/07/2020 113 (H) 65 - 99 mg/dL Final    Comment:    .            Fasting reference interval . For someone without known diabetes, a glucose value between 100 and 125 mg/dL is consistent with prediabetes and should be confirmed with a follow-up test. .   06/12/2019 114 (H) 65 - 99 mg/dL Final    Comment:    .            Fasting reference interval . For someone without known diabetes, a glucose value between 100 and 125 mg/dL is consistent with prediabetes and should be confirmed with a follow-up test. .   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   Muscle wasting and atrophy, not elsewhere classified, right thigh 05/31/2016    Past Surgical History:  Procedure Laterality Date   CERVICAL CONIZATION W/BX     KNEE ARTHROSCOPY  02/23/2012   Procedure: ARTHROSCOPY KNEE;  Surgeon: Alta Corning, MD;  Location: [place];  Service: Orthopedics;  Laterality: Right;  Lateral Retinacular Release, Chondroplasty Medial Femoral Condyle and Patella, Partial Lateral  Meniscectomy   QUADRICEPS TENDON REPAIR  11/15/2012   Procedure: REPAIR QUADRICEP TENDON;  Surgeon: Alta Corning, MD;  Location: [place];  Service: Orthopedics;  Laterality: Right;  WITH ALLOGRAFT   TONSILLECTOMY      Family History  Problem Relation Age of Onset   Diabetes Mother    Hypertension Mother    Congestive Heart Failure Mother    Diabetes Father  Kidney disease Father    Hypertension Father    Cancer Maternal Grandmother 35       Breast   Breast cancer Maternal Grandmother 73   Diabetes Paternal Grandmother     Social History   Socioeconomic History   Marital status: Married    Spouse name: [name]   Number of children: 1   Years of education: Not on file   Highest education level: Not on file  Occupational History   Occupation: disabled    Comment: workman's comp   Tobacco Use   Smoking status: Never   Smokeless tobacco: Never  Vaping Use   Vaping Use: Never used  Substance and Sexual Activity   Alcohol use: Yes    Alcohol/week: 1.0 standard drink    Types: 1 Glasses of wine per week    Comment: rarely   Drug use: No   Sexual activity: Never  Other Topics Concern   Not on file  Social History Narrative   She used to work but since 2013 , had a injury at work and one year later they found out that she had quad surgery, she still has weakness , daily pain and uses a cane. Fully disabled.    Husband also had a stroke in 2015  and works from home now    Social Determinants of Radio broadcast assistant Strain: Low Risk    Difficulty of Paying Living Expenses: Not hard at all  Food Insecurity: No Food Insecurity   Worried About Charity fundraiser in the Last Year: Never true   Arboriculturist in the Last Year: Never true  Transportation Needs: No Transportation Needs   Lack of Transportation (Medical): No   Lack of Transportation (Non-Medical): No  Physical Activity: Insufficiently Active   Days of Exercise per Week: 4 days    Minutes of Exercise per Session: 30 min  Stress: Stress Concern Present   Feeling of Stress : To some extent  Social Connections: Moderately Isolated   Frequency of Communication with Friends and Family: More than three times a week   Frequency of Social Gatherings with Friends and Family: Three times a week   Attends Religious Services: Never   Active Member of Clubs or Organizations: No   Attends Archivist Meetings: Never   Marital Status: Married  Human resources officer Violence: Not At Risk   Fear of Current or Ex-Partner: No   Emotionally Abused: No   Physically Abused: No   Sexually Abused: No     Current Outpatient Medications:    baclofen (LIORESAL) 10 MG tablet, Take 1 tablet (10 mg total) by mouth 3 (three) times daily as needed for muscle spasms., Disp: 90 tablet, Rfl: 1   diclofenac Sodium (VOLTAREN) 1 % GEL, Apply 4 g topically 4 (four) times daily., Disp: 300 g, Rfl: 1   Glucos-Chondroit-Collag-Hyal (JOINT SUPPORT FORMULA PO), Take by mouth., Disp: , Rfl:    ibuprofen (ADVIL) 200 MG tablet, Take 200 mg by mouth every 6 (six) hours as needed., Disp: , Rfl:    Multiple Vitamin (MULTIVITAMIN) tablet, Take 1 tablet by mouth daily., Disp: , Rfl:   Allergies  Allergen Reactions   Penicillins Hives     ROS  Constitutional: Negative for fever or weight change.  Respiratory: Negative for cough and shortness of breath.   Cardiovascular: Negative for chest pain or palpitations.  Gastrointestinal: Negative for abdominal pain, no bowel changes.  Musculoskeletal: positive for gait problem and right knee  joint swelling.  Skin: Negative for rash.  Neurological: Negative for dizziness or headache.  No other specific complaints in a complete review of systems (except as listed in HPI above).   Objective  Vitals:   01/09/22 1120  BP: 130/82  Pulse: 89  Resp: 16  SpO2: 98%  Weight: 198 lb (89.8 kg)  Height: '5\' 4"'  (1.626 m)    Body mass index is 33.99  kg/m.  Physical Exam  Constitutional: Patient appears well-developed and obese.  No distress.  HENT: Head: Normocephalic and atraumatic. Ears: B TMs ok, no erythema or effusion; Nose: Not done Mouth/Throat: not done  Eyes: Conjunctivae and EOM are normal. Pupils are equal, round, and reactive to light. No scleral icterus.  Neck: Normal range of motion. Neck supple. No JVD present. No thyromegaly present.  Cardiovascular: Normal rate, regular rhythm and normal heart sounds.  No murmur heard. No BLE edema. Pulmonary/Chest: Effort normal and breath sounds normal. No respiratory distress. Abdominal: Soft. Bowel sounds are normal, no distension. There is no tenderness. no masses Breast: no lumps or masses, no nipple discharge or rashes FEMALE GENITALIA:  Not done  RECTAL: not done  Musculoskeletal: decrease rom of right knee, with some effusion and right thigh weakness  Neurological: he is alert and oriented to person, place, and time. No cranial nerve deficit. Coordination, balance, strength, speech and gait are normal.  Skin: Skin is warm and dry. No rash noted. No erythema.  Psychiatric: Patient has a normal mood and affect. behavior is normal. Judgment and thought content normal.   Fall Risk: Fall Risk  01/09/2022 12/27/2021 09/13/2021 07/14/2021 06/07/2021  Falls in the past year? 0 0 0 0 0  Number falls in past yr: 0 0 0 0 0  Injury with Fall? 0 0 0 - 0  Risk Factor Category  - - - - -  Risk for fall due to : No Fall Risks No Fall Risks No Fall Risks Impaired balance/gait -  Follow up Falls prevention discussed Falls prevention discussed Falls prevention discussed Falls prevention discussed -     Functional Status Survey: Is the patient deaf or have difficulty hearing?: No Does the patient have difficulty seeing, even when wearing glasses/contacts?: No Does the patient have difficulty concentrating, remembering, or making decisions?: No Does the patient have difficulty walking or  climbing stairs?: Yes Does the patient have difficulty dressing or bathing?: Yes Does the patient have difficulty doing errands alone such as visiting a doctor's office or shopping?: Yes   Assessment & Plan  1. Well adult exam   2. Hyperglycemia   3. Dyslipidemia due to type 2 diabetes mellitus (HCC)  - Microalbumin / creatinine urine ratio - Lipid panel - COMPLETE METABOLIC PANEL WITH GFR - Hemoglobin A1c  4. Long-term use of high-risk medication  - COMPLETE METABOLIC PANEL WITH GFR  5. Dyslipidemia  - Lipid panel  6. Encounter for screening mammogram for breast cancer  - MM 3D SCREEN BREAST BILATERAL; Future    -USPSTF grade A and B recommendations reviewed with patient; age-appropriate recommendations, preventive care, screening tests, etc discussed and encouraged; healthy living encouraged; see AVS for patient education given to patient -Discussed importance of 150 minutes of physical activity weekly, eat two servings of fish weekly, eat one serving of tree nuts ( cashews, pistachios, pecans, almonds.Marland Kitchen) every other day, eat 6 servings of fruit/vegetables daily and drink plenty of water and avoid sweet beverages.   -Reviewed Health Maintenance: Yes.

## 2022-01-09 ENCOUNTER — Encounter: Payer: Self-pay | Admitting: Family Medicine

## 2022-01-09 ENCOUNTER — Ambulatory Visit (INDEPENDENT_AMBULATORY_CARE_PROVIDER_SITE_OTHER): Payer: Medicare HMO | Admitting: Family Medicine

## 2022-01-09 ENCOUNTER — Other Ambulatory Visit: Payer: Self-pay | Admitting: Family Medicine

## 2022-01-09 ENCOUNTER — Other Ambulatory Visit: Payer: Self-pay

## 2022-01-09 VITALS — BP 130/82 | HR 89 | Resp 16 | Ht 64.0 in | Wt 198.0 lb

## 2022-01-09 DIAGNOSIS — Z79899 Other long term (current) drug therapy: Secondary | ICD-10-CM | POA: Diagnosis not present

## 2022-01-09 DIAGNOSIS — E1169 Type 2 diabetes mellitus with other specified complication: Secondary | ICD-10-CM

## 2022-01-09 DIAGNOSIS — Z0000 Encounter for general adult medical examination without abnormal findings: Secondary | ICD-10-CM | POA: Diagnosis not present

## 2022-01-09 DIAGNOSIS — Z1231 Encounter for screening mammogram for malignant neoplasm of breast: Secondary | ICD-10-CM | POA: Diagnosis not present

## 2022-01-09 DIAGNOSIS — E785 Hyperlipidemia, unspecified: Secondary | ICD-10-CM

## 2022-01-09 DIAGNOSIS — R739 Hyperglycemia, unspecified: Secondary | ICD-10-CM

## 2022-01-09 MED ORDER — METFORMIN HCL ER 500 MG PO TB24: 500.0000 mg | ORAL_TABLET | Freq: Every day | ORAL | 1 refills | Status: DC

## 2022-01-10 LAB — HEMOGLOBIN A1C
Hgb A1c MFr Bld: 7.2 % of total Hgb — ABNORMAL HIGH (ref <5.7)
Mean Plasma Glucose: 160 mg/dL
eAG (mmol/L): 8.9 mmol/L

## 2022-01-10 LAB — LIPID PANEL
Cholesterol: 226 mg/dL — ABNORMAL HIGH (ref <200)
HDL: 74 mg/dL (ref >=50)
LDL Cholesterol (Calc): 123 mg/dL (calc) — ABNORMAL HIGH
Non-HDL Cholesterol (Calc): 152 mg/dL (calc) — ABNORMAL HIGH (ref <130)
Total CHOL/HDL Ratio: 3.1 (calc) (ref <5.0)
Triglycerides: 172 mg/dL — ABNORMAL HIGH (ref <150)

## 2022-01-10 LAB — COMPLETE METABOLIC PANEL WITH GFR
AG Ratio: 1.4 (calc) (ref 1.0–2.5)
ALT: 40 U/L — ABNORMAL HIGH (ref 6–29)
AST: 26 U/L (ref 10–35)
Albumin: 4.2 g/dL (ref 3.6–5.1)
Alkaline phosphatase (APISO): 164 U/L — ABNORMAL HIGH (ref 37–153)
BUN: 14 mg/dL (ref 7–25)
CO2: 27 mmol/L (ref 20–32)
Calcium: 9.8 mg/dL (ref 8.6–10.4)
Chloride: 100 mmol/L (ref 98–110)
Creat: 0.69 mg/dL (ref 0.50–1.05)
Globulin: 3 g/dL (calc) (ref 1.9–3.7)
Glucose, Bld: 165 mg/dL — ABNORMAL HIGH (ref 65–99)
Potassium: 4.6 mmol/L (ref 3.5–5.3)
Sodium: 136 mmol/L (ref 135–146)
Total Bilirubin: 0.3 mg/dL (ref 0.2–1.2)
Total Protein: 7.2 g/dL (ref 6.1–8.1)
eGFR: 97 mL/min/{1.73_m2} (ref >=60)

## 2022-01-10 LAB — MICROALBUMIN / CREATININE URINE RATIO
Creatinine, Urine: 80 mg/dL (ref 20–275)
Microalb Creat Ratio: 10 mcg/mg creat (ref <30)
Microalb, Ur: 0.8 mg/dL

## 2022-04-07 ENCOUNTER — Other Ambulatory Visit: Payer: Self-pay | Admitting: Family Medicine

## 2022-06-19 ENCOUNTER — Other Ambulatory Visit: Payer: Self-pay | Admitting: Family Medicine

## 2022-06-19 DIAGNOSIS — Z1231 Encounter for screening mammogram for malignant neoplasm of breast: Secondary | ICD-10-CM

## 2022-07-13 ENCOUNTER — Ambulatory Visit
Admission: RE | Admit: 2022-07-13 | Discharge: 2022-07-13 | Disposition: A | Discharge status: Home / Self Care | Payer: Medicare HMO | Source: Ambulatory Visit | Attending: Family Medicine | Admitting: Family Medicine

## 2022-07-13 DIAGNOSIS — Z1231 Encounter for screening mammogram for malignant neoplasm of breast: Secondary | ICD-10-CM | POA: Diagnosis not present

## 2022-08-03 DIAGNOSIS — E119 Type 2 diabetes mellitus without complications: Secondary | ICD-10-CM | POA: Diagnosis not present

## 2022-08-03 LAB — HM DIABETES EYE EXAM

## 2022-08-28 DIAGNOSIS — E1169 Type 2 diabetes mellitus with other specified complication: Secondary | ICD-10-CM | POA: Insufficient documentation

## 2022-08-28 DIAGNOSIS — E785 Hyperlipidemia, unspecified: Secondary | ICD-10-CM | POA: Insufficient documentation

## 2022-08-28 NOTE — Progress Notes (Signed)
Name: Barbara Rivers   MRN: [id number]    DOB: 1956-06-26   Date:08/29/2022       Progress Note  Subjective  Chief Complaint  Knee Pain/ Follow Up  HPI  Work related injury/chronic right knee pain/CRPS: she is still having daily pain on right knee, decrease rom , uses a cane for ambulation and when groceries shopping has to use the store cart to help her work,. She takes Ibuprofen prn, cannot take high dose because it causes upset stomach. Cannot tolerate Lyrica or gabapentin She stopped  using CBD gel , continues to take baclofen,  having frequent massage therapy to help with pain and voltaren gel to control pain  . She has atrophy of thigh and right leg weakness. She also has post-traumatic OA she had synvisc injection, unable to tolerate Meloxicam and is back on Ibuprofen.   Unable to work secondary to pain and has social security disability. Injury happened 07/2011, settlement 2018. Surgery was in 2013 and quadriceps tendon repair 2014 after a rupture.  She is using Newstep at home. .She states water activity helps a lot with her symptoms but her pool water is too cold at this time of the year. Pain is stable at 7/10 .   Patient Active Problem List   Diagnosis Date Noted   Dyslipidemia due to type 2 diabetes mellitus ([place]) 08/28/2022   VV (varicose veins) 07/26/2016   Venous insufficiency of right leg 07/05/2016   Obesity (BMI 30.0-34.9) 07/05/2016   Elevated blood pressure 07/05/2016   Right knee pain 05/31/2016   Post-traumatic osteoarthritis of right knee 05/31/2016   Quadriceps tendon rupture 05/31/2016   CRPS type II 05/31/2016   Muscle wasting and atrophy, not elsewhere classified, right thigh 05/31/2016    Past Surgical History:  Procedure Laterality Date   CERVICAL CONIZATION W/BX     KNEE ARTHROSCOPY  02/23/2012   Procedure: ARTHROSCOPY KNEE;  Surgeon: Alta Corning, MD;  Location: [place];  Service: Orthopedics;  Laterality: Right;  Lateral Retinacular  Release, Chondroplasty Medial Femoral Condyle and Patella, Partial Lateral Meniscectomy   QUADRICEPS TENDON REPAIR  11/15/2012   Procedure: REPAIR QUADRICEP TENDON;  Surgeon: Alta Corning, MD;  Location: [place];  Service: Orthopedics;  Laterality: Right;  WITH ALLOGRAFT   TONSILLECTOMY      Family History  Problem Relation Age of Onset   Diabetes Mother    Hypertension Mother    Congestive Heart Failure Mother    Diabetes Father    Kidney disease Father    Hypertension Father    Cancer Maternal Grandmother 71       Breast   Breast cancer Maternal Grandmother 80   Diabetes Paternal Grandmother     Social History   Tobacco Use   Smoking status: Never   Smokeless tobacco: Never  Substance Use Topics   Alcohol use: Yes    Alcohol/week: 1.0 standard drink of alcohol    Types: 1 Glasses of wine per week    Comment: rarely     Current Outpatient Medications:    baclofen (LIORESAL) 10 MG tablet, Take 1 tablet (10 mg total) by mouth 3 (three) times daily as needed for muscle spasms., Disp: 90 tablet, Rfl: 1   diclofenac Sodium (VOLTAREN) 1 % GEL, Apply 4 g topically 4 (four) times daily., Disp: 300 g, Rfl: 1   Glucos-Chondroit-Collag-Hyal (JOINT SUPPORT FORMULA PO), Take by mouth., Disp: , Rfl:    ibuprofen (ADVIL) 200 MG tablet, Take  200 mg by mouth every 6 (six) hours as needed., Disp: , Rfl:    metFORMIN (GLUCOPHAGE-XR) 500 MG 24 hr tablet, TAKE 1 TABLET(500 MG) BY MOUTH DAILY WITH BREAKFAST, Disp: 90 tablet, Rfl: 1   Multiple Vitamin (MULTIVITAMIN) tablet, Take 1 tablet by mouth daily., Disp: , Rfl:   Allergies  Allergen Reactions   Penicillins Hives    I personally reviewed active problem list, medication list, allergies, family history, social history, health maintenance with the patient/caregiver today.   ROS  Constitutional: Negative for fever or weight change.  Respiratory: Negative for cough and shortness of breath.   Cardiovascular: Negative  for chest pain or palpitations.  Gastrointestinal: Negative for abdominal pain, no bowel changes.  Musculoskeletal: positive for gait problem and right knee joint swelling.  Skin: Negative for rash.  Neurological: Negative for dizziness or headache.  No other specific complaints in a complete review of systems (except as listed in HPI above).   Objective  Vitals:   08/29/22 1058  BP: 132/72  Pulse: 86  Resp: 16  Temp: 97.9 F (36.6 C)  TempSrc: Oral  SpO2: 98%  Weight: 199 lb 6.4 oz (90.4 kg)  Height: '5\' 4"'$  (1.626 m)    Body mass index is 34.23 kg/m.  Physical Exam  Constitutional: Patient appears well-developed and well-nourished. Obese  No distress.  HEENT: head atraumatic, normocephalic, pupils equal and reactive to light, neck supple Cardiovascular: Normal rate, regular rhythm and normal heart sounds.  No murmur heard. No BLE edema. Pulmonary/Chest: Effort normal and breath sounds normal. No respiratory distress. Abdominal: Soft.  There is no tenderness. Muscular skeletal: decrease rom of right knee, effusion present  Psychiatric: Patient has a normal mood and affect. behavior is normal. Judgment and thought content normal.   Recent Results (from the past 2160 hour(s))  HM DIABETES EYE EXAM     Status: None   Collection Time: 08/03/22 12:00 AM  Result Value Ref Range   HM Diabetic Eye Exam No Retinopathy No Retinopathy    PHQ2/9:    08/29/2022   11:01 AM 01/09/2022   11:20 AM 12/27/2021    1:38 PM 09/13/2021   10:36 AM 07/14/2021    3:58 PM  Depression screen PHQ 2/9  Decreased Interest 0 0 0 0 0  Down, Depressed, Hopeless 0 0 0 1 0  PHQ - 2 Score 0 0 0 1 0  Altered sleeping 0 0  1   Tired, decreased energy 0 0  1   Change in appetite 0 0  0   Feeling bad or failure about yourself  0 0  0   Trouble concentrating 0 0  0   Moving slowly or fidgety/restless 0 0  0   Suicidal thoughts 0 0  0   PHQ-9 Score 0 0  3     phq 9 is negative   Fall Risk:     08/29/2022   11:01 AM 01/09/2022   11:20 AM 12/27/2021    1:40 PM 09/13/2021   10:36 AM 07/14/2021    3:57 PM  Fall Risk   Falls in the past year? 0 0 0 0 0  Number falls in past yr:  0 0 0 0  Injury with Fall?  0 0 0   Risk for fall due to : Impaired balance/gait;Impaired mobility No Fall Risks No Fall Risks No Fall Risks Impaired balance/gait  Follow up Falls prevention discussed;Education provided;Falls evaluation completed Falls prevention discussed Falls prevention discussed Falls prevention discussed Falls  prevention discussed      Functional Status Survey: Is the patient deaf or have difficulty hearing?: Yes Does the patient have difficulty seeing, even when wearing glasses/contacts?: Yes Does the patient have difficulty concentrating, remembering, or making decisions?: Yes Does the patient have difficulty walking or climbing stairs?: No Does the patient have difficulty dressing or bathing?: No Does the patient have difficulty doing errands alone such as visiting a doctor's office or shopping?: No    Assessment & Plan  1. Chronic pain of right knee  - diclofenac Sodium (VOLTAREN) 1 % GEL; Apply 4 g topically 4 (four) times daily.  Dispense: 300 g; Refill: 1 - baclofen (LIORESAL) 10 MG tablet; Take 1 tablet (10 mg total) by mouth 3 (three) times daily as needed for muscle spasms.  Dispense: 90 tablet; Refill: 1  2. Post-traumatic osteoarthritis of right knee  - diclofenac Sodium (VOLTAREN) 1 % GEL; Apply 4 g topically 4 (four) times daily.  Dispense: 300 g; Refill: 1  3. Muscle wasting and atrophy, not elsewhere classified, right thigh  - baclofen (LIORESAL) 10 MG tablet; Take 1 tablet (10 mg total) by mouth 3 (three) times daily as needed for muscle spasms.  Dispense: 90 tablet; Refill: 1  4. Spasm  - baclofen (LIORESAL) 10 MG tablet; Take 1 tablet (10 mg total) by mouth 3 (three) times daily as needed for muscle spasms.  Dispense: 90 tablet; Refill: 1

## 2022-08-29 ENCOUNTER — Ambulatory Visit: Payer: Self-pay | Admitting: Family Medicine

## 2022-08-29 ENCOUNTER — Encounter: Payer: Self-pay | Admitting: Family Medicine

## 2022-08-29 VITALS — BP 132/72 | HR 86 | Temp 97.9°F | Resp 16 | Ht 64.0 in | Wt 199.4 lb

## 2022-08-29 DIAGNOSIS — M62551 Muscle wasting and atrophy, not elsewhere classified, right thigh: Secondary | ICD-10-CM

## 2022-08-29 DIAGNOSIS — M25561 Pain in right knee: Secondary | ICD-10-CM

## 2022-08-29 DIAGNOSIS — G8929 Other chronic pain: Secondary | ICD-10-CM

## 2022-08-29 DIAGNOSIS — R252 Cramp and spasm: Secondary | ICD-10-CM

## 2022-08-29 DIAGNOSIS — Z1211 Encounter for screening for malignant neoplasm of colon: Secondary | ICD-10-CM

## 2022-08-29 DIAGNOSIS — E1169 Type 2 diabetes mellitus with other specified complication: Secondary | ICD-10-CM

## 2022-08-29 DIAGNOSIS — M1731 Unilateral post-traumatic osteoarthritis, right knee: Secondary | ICD-10-CM

## 2022-08-29 MED ORDER — DICLOFENAC SODIUM 1 % EX GEL: 4.0000 g | Freq: Four times a day (QID) | CUTANEOUS | 1 refills | Status: DC

## 2022-08-29 MED ORDER — BACLOFEN 10 MG PO TABS: 10.0000 mg | ORAL_TABLET | Freq: Three times a day (TID) | ORAL | 1 refills | Status: DC | PRN

## 2022-09-20 NOTE — Progress Notes (Unsigned)
Name: Barbara Rivers   MRN: [id number]    DOB: 17-Apr-1956   Date:09/21/2022       Progress Note  Subjective  Chief Complaint  Follow Up  HPI  Diabetes type [id number] 06/12/2019 with A1C 6.7 %, last visit A1C was 7.2 % today is down to 7 %, currently taking Metformin 500 mg ER, no side effects.   She has associated dyslipidemia, but she refuses statin therapy. She denies polyphagia, polydipsia or polyuria .    Dyslipidemia: she refuses statin therapy. Explained risk of heart attacks and strokes, discussed risk below . Discussed zetia but she is still not interested    The 10-year ASCVD risk score (Arnett DK, et al., 2019) is: 9.3%   Values used to calculate the score:     Age: 66 years     Sex: Female     Is Non-Hispanic African American: No     Diabetic: Yes     Tobacco smoker: No     Systolic Blood Pressure: 270 mmHg     Is BP treated: No     HDL Cholesterol: 74 mg/dL     Total Cholesterol: 226 mg/dL    Indigestion: she states that when her husband and her mother demand of her she feels stressed, she develops indigestion, causes her to have increase in eructation , improves with tums and mylanta and avoids spicy food. She denies associated SOB, diaphoresis, Pain is described as burning sensation.    Patient Active Problem List   Diagnosis Date Noted   Dyslipidemia due to type 2 diabetes mellitus (Barbara Rivers) 08/28/2022   VV (varicose veins) 07/26/2016   Venous insufficiency of right leg 07/05/2016   Obesity (BMI 30.0-34.9) 07/05/2016   Elevated blood pressure 07/05/2016   Right knee pain 05/31/2016   Post-traumatic osteoarthritis of right knee 05/31/2016   Quadriceps tendon rupture 05/31/2016   CRPS type II 05/31/2016   Muscle wasting and atrophy, not elsewhere classified, right thigh 05/31/2016    Past Surgical History:  Procedure Laterality Date   CERVICAL CONIZATION W/BX     KNEE ARTHROSCOPY  02/23/2012   Procedure: ARTHROSCOPY KNEE;  Surgeon: Barbara Corning, MD;   Location: [facility];  Service: Orthopedics;  Laterality: Right;  Lateral Retinacular Release, Chondroplasty Medial Femoral Condyle and Patella, Partial Lateral Meniscectomy   QUADRICEPS TENDON REPAIR  11/15/2012   Procedure: REPAIR QUADRICEP TENDON;  Surgeon: Barbara Corning, MD;  Location: [facility];  Service: Orthopedics;  Laterality: Right;  WITH ALLOGRAFT   TONSILLECTOMY      Family History  Problem Relation Age of Onset   Diabetes Mother    Hypertension Mother    Congestive Heart Failure Mother    Diabetes Father    Kidney disease Father    Hypertension Father    Cancer Maternal Grandmother 56       Breast   Breast cancer Maternal Grandmother 80   Diabetes Paternal Grandmother     Social History   Tobacco Use   Smoking status: Never   Smokeless tobacco: Never  Substance Use Topics   Alcohol use: Yes    Alcohol/week: 1.0 standard drink of alcohol    Types: 1 Glasses of wine per week    Comment: rarely     Current Outpatient Medications:    baclofen (LIORESAL) 10 MG tablet, Take 1 tablet (10 mg total) by mouth 3 (three) times daily as needed for muscle spasms., Disp: 90 tablet, Rfl: 1   diclofenac Sodium (VOLTAREN)  1 % GEL, Apply 4 g topically 4 (four) times daily., Disp: 300 g, Rfl: 1   famotidine (PEPCID) 20 MG tablet, Take 1 tablet (20 mg total) by mouth 2 (two) times daily as needed for heartburn or indigestion., Disp: 90 tablet, Rfl: 0   Glucos-Chondroit-Collag-Hyal (JOINT SUPPORT FORMULA PO), Take by mouth., Disp: , Rfl:    ibuprofen (ADVIL) 200 MG tablet, Take 200 mg by mouth every 6 (six) hours as needed., Disp: , Rfl:    Multiple Vitamin (MULTIVITAMIN) tablet, Take 1 tablet by mouth daily., Disp: , Rfl:    metFORMIN (GLUCOPHAGE-XR) 500 MG 24 hr tablet, Take 1 tablet (500 mg total) by mouth daily with breakfast., Disp: 90 tablet, Rfl: 1  Allergies  Allergen Reactions   Penicillins Hives    I personally reviewed active problem  list, medication list, allergies, family history, social history, health maintenance with the patient/caregiver today.   ROS  Constitutional: Negative for fever or weight change.  Respiratory: Negative for cough and shortness of breath.   Cardiovascular: Negative for chest pain or palpitations.  Gastrointestinal: Negative for abdominal pain, no bowel changes.  Musculoskeletal: Negative for gait problem or joint swelling.  Skin: Negative for rash.  Neurological: Negative for dizziness or headache.  No other specific complaints in a complete review of systems (except as listed in HPI above).   Objective  Vitals:   09/21/22 1113  BP: 124/72  Pulse: 89  Resp: 16  Temp: 98.3 F (36.8 C)  TempSrc: Oral  SpO2: 96%  Weight: 199 lb 11.2 oz (90.6 kg)  Height: '5\' 4"'$  (1.626 m)    Body mass index is 34.28 kg/m.  Physical Exam  Constitutional: Patient appears well-developed and well-nourished. Obese  No distress.  HEENT: head atraumatic, normocephalic, pupils equal and reactive to light, neck supple Cardiovascular: Normal rate, regular rhythm and normal heart sounds.  No murmur heard. No BLE edema. Pulmonary/Chest: Effort normal and breath sounds normal. No respiratory distress. Abdominal: Soft.  There is no tenderness. Muscular Skeletal: muscle waisting and gait problems due to right knee injury  Psychiatric: Patient has a normal mood and affect. behavior is normal. Judgment and thought content normal.   Recent Results (from the past 2160 hour(s))  HM DIABETES EYE EXAM     Status: None   Collection Time: 08/03/22 12:00 AM  Result Value Ref Range   HM Diabetic Eye Exam No Retinopathy No Retinopathy  POCT HgB A1C     Status: Abnormal   Collection Time: 09/21/22 11:28 AM  Result Value Ref Range   Hemoglobin A1C 7.0 (A) 4.0 - 5.6 %   HbA1c POC (<> result, manual entry)     HbA1c, POC (prediabetic range)     HbA1c, POC (controlled diabetic range)      Diabetic Foot  Exam: Diabetic Foot Exam - Simple   Simple Foot Form Visual Inspection No deformities, no ulcerations, no other skin breakdown bilaterally: Yes Sensation Testing Intact to touch and monofilament testing bilaterally: Yes Pulse Check Posterior Tibialis and Dorsalis pulse intact bilaterally: Yes Comments     PHQ2/9:    09/21/2022   11:27 AM 08/29/2022   11:01 AM 01/09/2022   11:20 AM 12/27/2021    1:38 PM 09/13/2021   10:36 AM  Depression screen PHQ 2/9  Decreased Interest 0 0 0 0 0  Down, Depressed, Hopeless 1 0 0 0 1  PHQ - 2 Score 1 0 0 0 1  Altered sleeping 0 0 0  1  Tired,  decreased energy 1 0 0  1  Change in appetite 0 0 0  0  Feeling bad or failure about yourself  0 0 0  0  Trouble concentrating 0 0 0  0  Moving slowly or fidgety/restless 0 0 0  0  Suicidal thoughts 0 0 0  0  PHQ-9 Score 2 0 0  3    phq 9 is positive   Fall Risk:    09/21/2022   11:27 AM 08/29/2022   11:01 AM 01/09/2022   11:20 AM 12/27/2021    1:40 PM 09/13/2021   10:36 AM  Fall Risk   Falls in the past year? 0 0 0 0 0  Number falls in past yr:   0 0 0  Injury with Fall?   0 0 0  Risk for fall due to : Impaired balance/gait Impaired balance/gait;Impaired mobility No Fall Risks No Fall Risks No Fall Risks  Follow up Falls prevention discussed;Falls evaluation completed;Education provided Falls prevention discussed;Education provided;Falls evaluation completed Falls prevention discussed Falls prevention discussed Falls prevention discussed      Functional Status Survey: Is the patient deaf or have difficulty hearing?: No Does the patient have difficulty seeing, even when wearing glasses/contacts?: No Does the patient have difficulty concentrating, remembering, or making decisions?: No Does the patient have difficulty walking or climbing stairs?: Yes Does the patient have difficulty dressing or bathing?: Yes Does the patient have difficulty doing errands alone such as visiting a doctor's office  or shopping?: Yes    Assessment & Plan  1. Dyslipidemia due to type 2 diabetes mellitus (HCC)  - POCT HgB A1C - HM Diabetes Foot Exam - metFORMIN (GLUCOPHAGE-XR) 500 MG 24 hr tablet; Take 1 tablet (500 mg total) by mouth daily with breakfast.  Dispense: 90 tablet; Refill: 1  2. Dyslipidemia  Refuses statin therapy   3. Colon cancer screening  - Cologuard   4. Gastroesophageal reflux disease without esophagitis  - famotidine (PEPCID) 20 MG tablet; Take 1 tablet (20 mg total) by mouth 2 (two) times daily as needed for heartburn or indigestion.  Dispense: 90 tablet; Refill: 0

## 2022-09-21 ENCOUNTER — Ambulatory Visit (INDEPENDENT_AMBULATORY_CARE_PROVIDER_SITE_OTHER): Payer: Medicare HMO | Admitting: Family Medicine

## 2022-09-21 ENCOUNTER — Encounter: Payer: Self-pay | Admitting: Family Medicine

## 2022-09-21 VITALS — BP 124/72 | HR 89 | Temp 98.3°F | Resp 16 | Ht 64.0 in | Wt 199.7 lb

## 2022-09-21 DIAGNOSIS — K219 Gastro-esophageal reflux disease without esophagitis: Secondary | ICD-10-CM | POA: Diagnosis not present

## 2022-09-21 DIAGNOSIS — Z1211 Encounter for screening for malignant neoplasm of colon: Secondary | ICD-10-CM

## 2022-09-21 DIAGNOSIS — E1169 Type 2 diabetes mellitus with other specified complication: Secondary | ICD-10-CM | POA: Diagnosis not present

## 2022-09-21 DIAGNOSIS — E785 Hyperlipidemia, unspecified: Secondary | ICD-10-CM

## 2022-09-21 LAB — POCT GLYCOSYLATED HEMOGLOBIN (HGB A1C): Hemoglobin A1C: 7 % — AB (ref 4.0–5.6)

## 2022-09-21 MED ORDER — METFORMIN HCL ER 500 MG PO TB24: 500.0000 mg | ORAL_TABLET | Freq: Every day | ORAL | 1 refills | Status: DC

## 2022-09-21 MED ORDER — FAMOTIDINE 20 MG PO TABS: 20.0000 mg | ORAL_TABLET | Freq: Two times a day (BID) | ORAL | 0 refills | Status: DC | PRN

## 2022-10-12 DIAGNOSIS — Z1211 Encounter for screening for malignant neoplasm of colon: Secondary | ICD-10-CM | POA: Diagnosis not present

## 2022-10-19 LAB — COLOGUARD: COLOGUARD: NEGATIVE

## 2022-11-01 ENCOUNTER — Other Ambulatory Visit: Payer: Self-pay | Admitting: Family Medicine

## 2022-11-01 DIAGNOSIS — K219 Gastro-esophageal reflux disease without esophagitis: Secondary | ICD-10-CM

## 2022-12-18 ENCOUNTER — Other Ambulatory Visit: Payer: Self-pay

## 2022-12-18 ENCOUNTER — Telehealth: Payer: Self-pay | Admitting: Family Medicine

## 2022-12-18 NOTE — Telephone Encounter (Signed)
Medication Refill - Medication: traMADol (ULTRAM) 50 MG tablet [id number]   Has the patient contacted their pharmacy? No because was using [facility] before (Agent: If no, request that the patient contact the pharmacy for the refill. If patient does not wish to contact the pharmacy document the reason why and proceed with request.) (Agent: If yes, when and what did the pharmacy advise?)patient called directl in  Preferred Pharmacy (with phone number or street name):  [facility] [id number], [place] Phone: [phone]  Fax: [phone]     Has the patient been seen for an appointment in the last year OR does the patient have an upcoming appointment? yes  Agent: Please be advised that RX refills may take up to 3 business days. We ask that you follow-up with your pharmacy.

## 2022-12-18 NOTE — Progress Notes (Unsigned)
Name: Barbara Rivers   MRN: [id number]    DOB: 10-20-1956   Date:12/18/2022       Progress Note  Subjective  Chief Complaint  Generalized Pain  I connected with  [name]  on 12/18/22 at  9:20 AM EST by a video enabled telemedicine application and verified that I am speaking with the correct person using two identifiers.  I discussed the limitations of evaluation and management by telemedicine and the availability of in person appointments. The patient expressed understanding and agreed to proceed with the virtual visit  Staff also discussed with the patient that there may be a patient responsible charge related to this service. Patient Location: at home  Provider Location: [facility] Additional Individuals present: husband   HPI  Work related injury/chronic right knee pain/CRPS: she is still having daily pain on right knee, decrease rom , she  uses a cane for ambulation and when groceries shopping has to use the store cart to help her work,.  Cannot tolerate Lyrica or gabapentin She is still taking baclofen prn, she usually takes 200 mg of ibuprofen twice per day but over the past couple months the pain has intensified and has been taking up to 6 ibuprofen per day but noticed upset stomach so she took some old Tramadol and it helped with the pain.  She also has massage therapy and topical voltaren. . She has atrophy of thigh and right leg weakness. She also has post-traumatic OA she had synvisc injection. Unable to work secondary to pain and has social security disability. Injury happened 07/2011, settlement 2018. Surgery was in 2013 and quadriceps tendon repair 2014 after a rupture.  She is using [employer] at home. .She states water activity helps a lot with her symptoms but her pool water is too cold at this time of the year. Pain level right now is 9/10, used to be around 7   Patient Active Problem List   Diagnosis Date Noted   Dyslipidemia due to type 2 diabetes mellitus ([place]) 08/28/2022    VV (varicose veins) 07/26/2016   Venous insufficiency of right leg 07/05/2016   Obesity (BMI 30.0-34.9) 07/05/2016   Elevated blood pressure 07/05/2016   Right knee pain 05/31/2016   Post-traumatic osteoarthritis of right knee 05/31/2016   Quadriceps tendon rupture 05/31/2016   CRPS type II 05/31/2016   Muscle wasting and atrophy, not elsewhere classified, right thigh 05/31/2016    Past Surgical History:  Procedure Laterality Date   CERVICAL CONIZATION W/BX     KNEE ARTHROSCOPY  02/23/2012   Procedure: ARTHROSCOPY KNEE;  Surgeon: Alta Corning, MD;  Location: [facility];  Service: Orthopedics;  Laterality: Right;  Lateral Retinacular Release, Chondroplasty Medial Femoral Condyle and Patella, Partial Lateral Meniscectomy   QUADRICEPS TENDON REPAIR  11/15/2012   Procedure: REPAIR QUADRICEP TENDON;  Surgeon: Alta Corning, MD;  Location: [facility];  Service: Orthopedics;  Laterality: Right;  WITH ALLOGRAFT   TONSILLECTOMY      Family History  Problem Relation Age of Onset   Diabetes Mother    Hypertension Mother    Congestive Heart Failure Mother    Diabetes Father    Kidney disease Father    Hypertension Father    Cancer Maternal Grandmother 57       Breast   Breast cancer Maternal Grandmother 80   Diabetes Paternal Grandmother     Social History   Socioeconomic History   Marital status: Married    Spouse  name: [name]   Number of children: 1   Years of education: Not on file   Highest education level: Not on file  Occupational History   Occupation: disabled    Comment: workman's comp   Tobacco Use   Smoking status: Never   Smokeless tobacco: Never  Vaping Use   Vaping Use: Never used  Substance and Sexual Activity   Alcohol use: Yes    Alcohol/week: 1.0 standard drink of alcohol    Types: 1 Glasses of wine per week    Comment: rarely   Drug use: No   Sexual activity: Never  Other Topics Concern   Not on file  Social History  Narrative   She used to work but since 2013 , had a injury at work and one year later they found out that she had quad surgery, she still has weakness , daily pain and uses a cane. Fully disabled.    Husband also had a stroke in 2015  and works from home now    Social Determinants of [place] Strain: [place]  (01/09/2022)   Overall Financial Resource Strain (CARDIA)    Difficulty of Paying Living Expenses: Not hard at all  Food Insecurity: No Food Insecurity (01/09/2022)   Hunger Vital Sign    Worried About Running Out of Food in the Last Year: Never true    Ran Out of Food in the Last Year: Never true  Transportation Needs: No Transportation Needs (01/09/2022)   PRAPARE - Hydrologist (Medical): No    Lack of Transportation (Non-Medical): No  Physical Activity: Insufficiently Active (01/09/2022)   Exercise Vital Sign    Days of Exercise per Week: 4 days    Minutes of Exercise per Session: 30 min  Stress: Stress Concern Present (01/09/2022)   [place]    Feeling of Stress : To some extent  Social Connections: Moderately Isolated (01/09/2022)   Social Connection and Isolation Panel [NHANES]    Frequency of Communication with Friends and Family: More than three times a week    Frequency of Social Gatherings with Friends and Family: Three times a week    Attends Religious Services: Never    Active Member of Clubs or Organizations: No    Attends Archivist Meetings: Never    Marital Status: Married  Human resources officer Violence: Not At Risk (01/09/2022)   Humiliation, Afraid, Rape, and Kick questionnaire    Fear of Current or Ex-Partner: No    Emotionally Abused: No    Physically Abused: No    Sexually Abused: No     Current Outpatient Medications:    baclofen (LIORESAL) 10 MG tablet, Take 1 tablet (10 mg total) by mouth 3 (three) times daily as needed for muscle spasms.,  Disp: 90 tablet, Rfl: 1   diclofenac Sodium (VOLTAREN) 1 % GEL, Apply 4 g topically 4 (four) times daily., Disp: 300 g, Rfl: 1   famotidine (PEPCID) 20 MG tablet, TAKE 1 TABLET BY MOUTH TWICE A DAY AS NEEDED FOR HEARTBURN OR INDIGESTION, Disp: 90 tablet, Rfl: 0   Glucos-Chondroit-Collag-Hyal (JOINT SUPPORT FORMULA PO), Take by mouth., Disp: , Rfl:    ibuprofen (ADVIL) 200 MG tablet, Take 200 mg by mouth every 6 (six) hours as needed., Disp: , Rfl:    metFORMIN (GLUCOPHAGE-XR) 500 MG 24 hr tablet, Take 1 tablet (500 mg total) by mouth daily with breakfast., Disp: 90 tablet, Rfl:  1   Multiple Vitamin (MULTIVITAMIN) tablet, Take 1 tablet by mouth daily., Disp: , Rfl:   Allergies  Allergen Reactions   Penicillins Hives    I personally reviewed active problem list, medication list, allergies, family history, social history, health maintenance with the patient/caregiver today.   ROS  Ten systems reviewed and is negative except as mentioned in HPI   Objective  Virtual encounter, vitals not obtained.  There is no height or weight on file to calculate BMI.  Physical Exam  Awake, alert and oriented   PHQ2/9:    09/21/2022   11:27 AM 08/29/2022   11:01 AM 01/09/2022   11:20 AM 12/27/2021    1:38 PM 09/13/2021   10:36 AM  Depression screen PHQ 2/9  Decreased Interest 0 0 0 0 0  Down, Depressed, Hopeless 1 0 0 0 1  PHQ - 2 Score 1 0 0 0 1  Altered sleeping 0 0 0  1  Tired, decreased energy 1 0 0  1  Change in appetite 0 0 0  0  Feeling bad or failure about yourself  0 0 0  0  Trouble concentrating 0 0 0  0  Moving slowly or fidgety/restless 0 0 0  0  Suicidal thoughts 0 0 0  0  PHQ-9 Score 2 0 0  3   PHQ-2/9 Result is positive.    Fall Risk:    09/21/2022   11:27 AM 08/29/2022   11:01 AM 01/09/2022   11:20 AM 12/27/2021    1:40 PM 09/13/2021   10:36 AM  Fall Risk   Falls in the past year? 0 0 0 0 0  Number falls in past yr:   0 0 0  Injury with Fall?   0 0 0  Risk for fall  due to : Impaired balance/gait Impaired balance/gait;Impaired mobility No Fall Risks No Fall Risks No Fall Risks  Follow up Falls prevention discussed;Falls evaluation completed;Education provided Falls prevention discussed;Education provided;Falls evaluation completed Falls prevention discussed Falls prevention discussed Falls prevention discussed     Assessment & Plan  1. Chronic pain of right knee  - traMADol (ULTRAM) 50 MG tablet; Take 1 tablet (50 mg total) by mouth 3 times/day as needed-between meals & bedtime.  Dispense: 120 tablet; Refill: 0  2. Post-traumatic osteoarthritis of right knee  - traMADol (ULTRAM) 50 MG tablet; Take 1 tablet (50 mg total) by mouth 3 times/day as needed-between meals & bedtime.  Dispense: 120 tablet; Refill: 0  3. Muscle wasting and atrophy, not elsewhere classified, right thigh  - traMADol (ULTRAM) 50 MG tablet; Take 1 tablet (50 mg total) by mouth 3 times/day as needed-between meals & bedtime.  Dispense: 120 tablet; Refill: 0   I discussed the assessment and treatment plan with the patient. The patient was provided an opportunity to ask questions and all were answered. The patient agreed with the plan and demonstrated an understanding of the instructions.  The patient was advised to call back or seek an in-person evaluation if the symptoms worsen or if the condition fails to improve as anticipated.  I provided 15  minutes of non-face-to-face time during this encounter.

## 2022-12-19 ENCOUNTER — Encounter: Payer: Self-pay | Admitting: Family Medicine

## 2022-12-19 ENCOUNTER — Telehealth (INDEPENDENT_AMBULATORY_CARE_PROVIDER_SITE_OTHER): Payer: Medicare HMO | Admitting: Family Medicine

## 2022-12-19 VITALS — Ht 64.0 in | Wt 199.0 lb

## 2022-12-19 DIAGNOSIS — M1731 Unilateral post-traumatic osteoarthritis, right knee: Secondary | ICD-10-CM | POA: Diagnosis not present

## 2022-12-19 DIAGNOSIS — G8929 Other chronic pain: Secondary | ICD-10-CM

## 2022-12-19 DIAGNOSIS — M62551 Muscle wasting and atrophy, not elsewhere classified, right thigh: Secondary | ICD-10-CM | POA: Diagnosis not present

## 2022-12-19 DIAGNOSIS — M25561 Pain in right knee: Secondary | ICD-10-CM

## 2022-12-19 MED ORDER — TRAMADOL HCL 50 MG PO TABS: 50.0000 mg | ORAL_TABLET | Freq: Two times a day (BID) | ORAL | 0 refills | Status: DC | PRN

## 2022-12-28 ENCOUNTER — Ambulatory Visit (INDEPENDENT_AMBULATORY_CARE_PROVIDER_SITE_OTHER): Payer: Medicare HMO

## 2022-12-28 VITALS — Ht 64.0 in | Wt 192.0 lb

## 2022-12-28 DIAGNOSIS — Z0000 Encounter for general adult medical examination without abnormal findings: Secondary | ICD-10-CM

## 2022-12-28 DIAGNOSIS — Z13820 Encounter for screening for osteoporosis: Secondary | ICD-10-CM

## 2022-12-28 NOTE — Progress Notes (Signed)
I connected with  Barbara Rivers on 12/28/22 by a audio enabled telemedicine application and verified that I am speaking with the correct person using two identifiers.  Patient Location: Home  Provider Location: Office/Clinic  I discussed the limitations of evaluation and management by telemedicine. The patient expressed understanding and agreed to proceed.  Subjective:   Barbara Rivers is a 67 y.o. female who presents for Medicare Annual (Subsequent) preventive examination.  Review of Systems    Cardiac Risk Factors include: advanced age (>67mn, >>16women);diabetes mellitus;obesity (BMI >30kg/m2);sedentary lifestyle;dyslipidemia    Objective:    Today's Vitals   12/28/22 1319 12/28/22 1320  Weight: 192 lb (87.1 kg)   Height: 5' 4"$  (1.626 m)   PainSc:  8    Body mass index is 32.96 kg/m.     12/28/2022    1:34 PM 12/27/2021    1:40 PM 11/11/2020    1:58 PM 06/17/2019    3:57 PM 07/30/2017    1:54 PM 09/18/2016    2:33 PM 07/05/2016   10:21 AM  Advanced Directives  Does Patient Have a Medical Advance Directive? Yes Yes Yes Yes No No No  Type of ACorporate treasurerof A[place]Living will [place]Living will [place]Living will     Copy of [place] Chart?  No - copy requested No - copy requested No - copy requested     Would patient like information on creating a medical advance directive?     No - Patient declined No - patient declined information No - patient declined information    Current Medications (verified) Outpatient Encounter Medications as of 12/28/2022  Medication Sig   baclofen (LIORESAL) 10 MG tablet Take 1 tablet (10 mg total) by mouth 3 (three) times daily as needed for muscle spasms.   diclofenac Sodium (VOLTAREN) 1 % GEL Apply 4 g topically 4 (four) times daily.   famotidine (PEPCID) 20 MG tablet TAKE 1 TABLET BY MOUTH TWICE A DAY AS NEEDED FOR HEARTBURN OR INDIGESTION    Glucos-Chondroit-Collag-Hyal (JOINT SUPPORT FORMULA PO) Take by mouth.   metFORMIN (GLUCOPHAGE-XR) 500 MG 24 hr tablet Take 1 tablet (500 mg total) by mouth daily with breakfast.   Multiple Vitamin (MULTIVITAMIN) tablet Take 1 tablet by mouth daily.   traMADol (ULTRAM) 50 MG tablet Take 1 tablet (50 mg total) by mouth 3 times/day as needed-between meals & bedtime.   ibuprofen (ADVIL) 200 MG tablet Take 200 mg by mouth every 6 (six) hours as needed. (Patient not taking: Reported on 12/28/2022)   No facility-administered encounter medications on file as of 12/28/2022.    Allergies (verified) Penicillins   History: Past Medical History:  Diagnosis Date   Arthritis    right knee   Disorder of jaw    occ. becomes tight and locks with dental procedures   Immature cataract    left eye   Quadriceps tendon rupture 11/2012   right   Past Surgical History:  Procedure Laterality Date   CERVICAL CONIZATION W/BX     KNEE ARTHROSCOPY  02/23/2012   Procedure: ARTHROSCOPY KNEE;  Surgeon: JAlta Corning MD;  [facility]  Service: Orthopedics;  Laterality: Right;  Lateral Retinacular Release, Chondroplasty Medial Femoral Condyle and Patella, Partial Lateral Meniscectomy   QUADRICEPS TENDON REPAIR  11/15/2012   Procedure: REPAIR QUADRICEP TENDON;  Surgeon: JAlta Corning MD;  [facility]  Service: Orthopedics;  Laterality: Right;  WITH ALLOGRAFT   TONSILLECTOMY     Family History  Problem Relation Age of Onset   Diabetes Mother    Hypertension Mother    Congestive Heart Failure Mother    Diabetes Father    Kidney disease Father    Hypertension Father    Cancer Maternal Grandmother 19       Breast   Breast cancer Maternal Grandmother 5   Diabetes Paternal Grandmother    Social History   Socioeconomic History   Marital status: Married    Spouse name: [name]   Number of children: 1   Years of education: Not on file   Highest education level:  Not on file  Occupational History   Occupation: disabled    Comment: workman's comp   Tobacco Use   Smoking status: Never   Smokeless tobacco: Never  Vaping Use   Vaping Use: Never used  Substance and Sexual Activity   Alcohol use: Yes    Alcohol/week: 1.0 standard drink of alcohol    Types: 1 Glasses of wine per week    Comment: rarely   Drug use: No   Sexual activity: Never  Other Topics Concern   Not on file  Social History Narrative   She used to work but since 2013 , had a injury at work and one year later they found out that she had quad surgery, she still has weakness , daily pain and uses a cane. Fully disabled.    Husband also had a stroke in 2015  and works from home now    Social Determinants of [place] Strain: [place]  (12/28/2022)   Overall Financial Resource Strain (CARDIA)    Difficulty of Paying Living Expenses: Not hard at all  Food Insecurity: No Food Insecurity (12/28/2022)   Hunger Vital Sign    Worried About Running Out of Food in the Last Year: Never true    Ran Out of Food in the Last Year: Never true  Transportation Needs: No Transportation Needs (12/28/2022)   PRAPARE - Hydrologist (Medical): No    Lack of Transportation (Non-Medical): No  Physical Activity: Insufficiently Active (12/28/2022)   Exercise Vital Sign    Days of Exercise per Week: 3 days    Minutes of Exercise per Session: 30 min  Stress: Stress Concern Present (12/28/2022)   [place]    Feeling of Stress : Rather much  Social Connections: Moderately Isolated (12/28/2022)   Social Connection and Isolation Panel [NHANES]    Frequency of Communication with Friends and Family: More than three times a week    Frequency of Social Gatherings with Friends and Family: Three times a week    Attends Religious Services: Never    Active Member of Clubs or Organizations: No    Attends  Music therapist: Never    Marital Status: Married    Tobacco Counseling Counseling given: Not Answered   Clinical Intake:  Pre-visit preparation completed: Yes  Pain : 0-10 Pain Score: 8  Pain Type: Chronic pain Pain Location: Knee (rt leg) Pain Orientation: Right Pain Descriptors / Indicators: Aching, Burning Pain Onset: Other (comment) Pain Frequency: Constant Pain Relieving Factors: heat, medications: muscle relaxer, massage therapy  Pain Relieving Factors: heat, medications: muscle relaxer, massage therapy  BMI - recorded: 32.96 Nutritional Status: BMI > 30  Obese Nutritional Risks: None Diabetes: Yes CBG done?: No Did pt. bring in CBG  monitor from home?: No  How often do you need to have someone help you when you read instructions, pamphlets, or other written materials from your doctor or pharmacy?: 1 - Never  Diabetic?yes  Interpreter Needed?: No  Information entered by :: B.Leshonda Galambos,LPN   Activities of Daily Living    12/28/2022    1:35 PM 12/19/2022    8:35 AM  In your present state of health, do you have any difficulty performing the following activities:  Hearing? 0 0  Vision? 0 0  Difficulty concentrating or making decisions? 0 0  Walking or climbing stairs? 1 1  Comment pt has stair lift   Dressing or bathing? 0 0  Doing errands, shopping? 0 0  Preparing Food and eating ? N   Using the Toilet? N   In the past six months, have you accidently leaked urine? N   Do you have problems with loss of bowel control? N   Managing your Medications? N   Managing your Finances? N   Housekeeping or managing your Housekeeping? N     Patient Care Team: Steele Sizer, MD as PCP - General (Family Medicine) Leandrew Koyanagi, MD as Referring Physician (Ophthalmology)  Indicate any recent Medical Services you may have received from other than [facility] providers in the past year (date may be approximate).     Assessment:   This is a routine  wellness examination for Barbara Rivers.  Hearing/Vision screen Hearing Screening - Comments:: Adequate hearing Vision Screening - Comments:: Adequate vision w/glasses Dr [facility]  Dietary issues and exercise activities discussed: Current Exercise Habits: The patient does not participate in regular exercise at present, Exercise limited by: neurologic condition(s)   Goals Addressed   None    Depression Screen    12/28/2022    1:29 PM 12/19/2022    8:34 AM 09/21/2022   11:27 AM 08/29/2022   11:01 AM 01/09/2022   11:20 AM 12/27/2021    1:38 PM 09/13/2021   10:36 AM  PHQ 2/9 Scores  PHQ - 2 Score 0 0 1 0 0 0 1  PHQ- 9 Score 0 0 2 0 0  3    Fall Risk    12/28/2022    1:25 PM 12/19/2022    8:34 AM 09/21/2022   11:27 AM 08/29/2022   11:01 AM 01/09/2022   11:20 AM  Fall Risk   Falls in the past year? 0 0 0 0 0  Number falls in past yr: 0    0  Injury with Fall? 0    0  Risk for fall due to : No Fall Risks No Fall Risks Impaired balance/gait Impaired balance/gait;Impaired mobility No Fall Risks  Follow up Education provided;Falls prevention discussed Falls prevention discussed Falls prevention discussed;Falls evaluation completed;Education provided Falls prevention discussed;Education provided;Falls evaluation completed Falls prevention discussed    FALL RISK PREVENTION PERTAINING TO THE HOME:  Any stairs in or around the home? Yes  If so, are there any without handrails? Yes stair lift  Home free of loose throw rugs in walkways, pet beds, electrical cords, etc? Yes  Adequate lighting in your home to reduce risk of falls? Yes   ASSISTIVE DEVICES UTILIZED TO PREVENT FALLS:  Life alert? No  Use of a cane, walker or w/c? Yes cane Grab bars in the bathroom? Yes  Shower chair or bench in shower? Yes  Elevated toilet seat or a handicapped toilet? No   Cognitive Function:        12/28/2022  1:43 PM  6CIT Screen  What Year? 0 points  What month? 0 points  What time? 0  points  Count back from 20 0 points  Months in reverse 0 points  Repeat phrase 0 points  Total Score 0 points    Immunizations Immunization History  Administered Date(s) Administered   Influenza Inj Mdck Quad Pf 09/16/2019   Influenza,inj,Quad PF,6+ Mos 09/18/2016, 09/25/2017, 09/11/2018   PNEUMOCOCCAL CONJUGATE-20 06/07/2021   Tdap 06/07/2021   Zoster Recombinat (Shingrix) 03/25/2018, 08/22/2018    TDAP status: Up to date  Flu Vaccine status: Up to date  Pneumococcal vaccine status: Up to date  Covid-19 vaccine status: Declined, Education has been provided regarding the importance of this vaccine but patient still declined. Advised may receive this vaccine at local pharmacy or Health Dept.or vaccine clinic. Aware to provide a copy of the vaccination record if obtained from local pharmacy or Health Dept. Verbalized acceptance and understanding.  Qualifies for Shingles Vaccine? Yes   Zostavax completed Yes   Shingrix Completed?: Yes  Screening Tests Health Maintenance  Topic Date Due   DEXA SCAN  10/11/2018   PAP SMEAR-Modifier  06/11/2022   Diabetic kidney evaluation - eGFR measurement  01/10/2023   Diabetic kidney evaluation - Urine ACR  01/10/2023   INFLUENZA VACCINE  02/04/2023 (Originally 06/06/2022)   HIV Screening  08/16/2029 (Originally 04/14/1971)   HEMOGLOBIN A1C  03/22/2023   OPHTHALMOLOGY EXAM  08/04/2023   FOOT EXAM  09/22/2023   Medicare Annual Wellness (AWV)  12/29/2023   MAMMOGRAM  07/13/2024   Fecal DNA (Cologuard)  10/12/2025   DTaP/Tdap/Td (2 - Td or Tdap) 06/08/2031   Pneumonia Vaccine 58+ Years old  Completed   Hepatitis C Screening  Completed   Zoster Vaccines- Shingrix  Completed   HPV VACCINES  Aged Out   COVID-19 Vaccine  Discontinued    Health Maintenance  Health Maintenance Due  Topic Date Due   DEXA SCAN  10/11/2018   PAP SMEAR-Modifier  06/11/2022   Diabetic kidney evaluation - eGFR measurement  01/10/2023   Diabetic kidney evaluation  - Urine ACR  01/10/2023    Cologuard: yes 10/19/2022  Mammogram status: Completed yes. Repeat every year  Bone Density status: Ordered yes. Pt provided with contact info and advised to call to schedule appt.  Lung Cancer Screening: (Low Dose CT Chest recommended if Age 71-80 years, 30 pack-year currently smoking OR have quit w/in 15years.) does not qualify.   Lung Cancer Screening Referral: no  Additional Screening:  Hepatitis C Screening: does not qualify; Completed yes in past  Vision Screening: Recommended annual ophthalmology exams for early detection of glaucoma and other disorders of the eye. Is the patient up to date with their annual eye exam?  Yes  Who is the provider or what is the name of the office in which the patient attends annual eye exams? [facility] If pt is not established with a provider, would they like to be referred to a provider to establish care? No .   Dental Screening: Recommended annual dental exams for proper oral hygiene  Community Resource Referral / Chronic Care Management: CRR required this visit?  No   CCM required this visit?  No     Plan:     I have personally reviewed and noted the following in the patient's chart:   Medical and social history Use of alcohol, tobacco or illicit drugs  Current medications and supplements including opioid prescriptions. Patient is currently taking opioid prescriptions. Information  provided to patient regarding non-opioid alternatives. Patient advised to discuss non-opioid treatment plan with their provider. Functional ability and status Nutritional status Physical activity Advanced directives List of other physicians Hospitalizations, surgeries, and ER visits in previous 12 months Vitals Screenings to include cognitive, depression, and falls Referrals and appointments  In addition, I have reviewed and discussed with patient certain preventive protocols, quality metrics, and best practice  recommendations. A written personalized care plan for preventive services as well as general preventive health recommendations were provided to patient.     Roger Shelter, LPN   [id number]   Nurse Notes: pt is doing well. Does state she is experiencing some worry about her mother living alone and away from family.  Pt says never received bone density:order expired now. Order/referral placed for bone density

## 2022-12-28 NOTE — Patient Instructions (Signed)
Barbara Rivers , Thank you for taking time to come for your Medicare Wellness Visit. I appreciate your ongoing commitment to your health goals. Please review the following plan we discussed and let me know if I can assist you in the future.   These are the goals we discussed:  Goals      Weight (lb) < 170 lb (77.1 kg)     Recommend cutting back on portion sizes to aid in loosing 20 lbs.         This is a list of the screening recommended for you and due dates:  Health Maintenance  Topic Date Due   DEXA scan (bone density measurement)  10/11/2018   Pap Smear  06/11/2022   Yearly kidney function blood test for diabetes  01/10/2023   Yearly kidney health urinalysis for diabetes  01/10/2023   Flu Shot  02/04/2023*   HIV Screening  08/16/2029*   Hemoglobin A1C  03/22/2023   Eye exam for diabetics  08/04/2023   Complete foot exam   09/22/2023   Medicare Annual Wellness Visit  12/29/2023   Mammogram  07/13/2024   Cologuard (Stool DNA test)  10/12/2025   DTaP/Tdap/Td vaccine (2 - Td or Tdap) 06/08/2031   Pneumonia Vaccine  Completed   Hepatitis C Screening: USPSTF Recommendation to screen - Ages 73-79 yo.  Completed   Zoster (Shingles) Vaccine  Completed   HPV Vaccine  Aged Out   COVID-19 Vaccine  Discontinued  *Topic was postponed. The date shown is not the original due date.    Advanced directives: yes  Conditions/risks identified: low falls risk  Next appointment: Follow up in one year for your annual wellness visit 01/03/2024 @1pm$  telephone   Preventive Care 65 Years and Older, Female Preventive care refers to lifestyle choices and visits with your health care provider that can promote health and wellness. What does preventive care include? A yearly physical exam. This is also called an annual well check. Dental exams once or twice a year. Routine eye exams. Ask your health care provider how often you should have your eyes checked. Personal lifestyle choices,  including: Daily care of your teeth and gums. Regular physical activity. Eating a healthy diet. Avoiding tobacco and drug use. Limiting alcohol use. Practicing safe sex. Taking low-dose aspirin every day. Taking vitamin and mineral supplements as recommended by your health care provider. What happens during an annual well check? The services and screenings done by your health care provider during your annual well check will depend on your age, overall health, lifestyle risk factors, and family history of disease. Counseling  Your health care provider may ask you questions about your: Alcohol use. Tobacco use. Drug use. Emotional well-being. Home and relationship well-being. Sexual activity. Eating habits. History of falls. Memory and ability to understand (cognition). Work and work Statistician. Reproductive health. Screening  You may have the following tests or measurements: Height, weight, and BMI. Blood pressure. Lipid and cholesterol levels. These may be checked every 5 years, or more frequently if you are over 19 years old. Skin check. Lung cancer screening. You may have this screening every year starting at age 26 if you have a 30-pack-year history of smoking and currently smoke or have quit within the past 15 years. Fecal occult blood test (FOBT) of the stool. You may have this test every year starting at age 8. Flexible sigmoidoscopy or colonoscopy. You may have a sigmoidoscopy every 5 years or a colonoscopy every 10 years starting at age 27.  Hepatitis C blood test. Hepatitis B blood test. Sexually transmitted disease (STD) testing. Diabetes screening. This is done by checking your blood sugar (glucose) after you have not eaten for a while (fasting). You may have this done every 1-3 years. Bone density scan. This is done to screen for osteoporosis. You may have this done starting at age 27. Mammogram. This may be done every 1-2 years. Talk to your health care provider  about how often you should have regular mammograms. Talk with your health care provider about your test results, treatment options, and if necessary, the need for more tests. Vaccines  Your health care provider may recommend certain vaccines, such as: Influenza vaccine. This is recommended every year. Tetanus, diphtheria, and acellular pertussis (Tdap, Td) vaccine. You may need a Td booster every 10 years. Zoster vaccine. You may need this after age 10. Pneumococcal 13-valent conjugate (PCV13) vaccine. One dose is recommended after age 77. Pneumococcal polysaccharide (PPSV23) vaccine. One dose is recommended after age 53. Talk to your health care provider about which screenings and vaccines you need and how often you need them. This information is not intended to replace advice given to you by your health care provider. Make sure you discuss any questions you have with your health care provider. Document Released: 11/19/2015 Document Revised: 07/12/2016 Document Reviewed: 08/24/2015 Elsevier Interactive Patient Education  2017 [place] Prevention in the Home Falls can cause injuries. They can happen to people of all ages. There are many things you can do to make your home safe and to help prevent falls. What can I do on the outside of my home? Regularly fix the edges of walkways and driveways and fix any cracks. Remove anything that might make you trip as you walk through a door, such as a raised step or threshold. Trim any bushes or trees on the path to your home. Use bright outdoor lighting. Clear any walking paths of anything that might make someone trip, such as rocks or tools. Regularly check to see if handrails are loose or broken. Make sure that both sides of any steps have handrails. Any raised decks and porches should have guardrails on the edges. Have any leaves, snow, or ice cleared regularly. Use sand or salt on walking paths during winter. Clean up any spills in  your garage right away. This includes oil or grease spills. What can I do in the bathroom? Use night lights. Install grab bars by the toilet and in the tub and shower. Do not use towel bars as grab bars. Use non-skid mats or decals in the tub or shower. If you need to sit down in the shower, use a plastic, non-slip stool. Keep the floor dry. Clean up any water that spills on the floor as soon as it happens. Remove soap buildup in the tub or shower regularly. Attach bath mats securely with double-sided non-slip rug tape. Do not have throw rugs and other things on the floor that can make you trip. What can I do in the bedroom? Use night lights. Make sure that you have a light by your bed that is easy to reach. Do not use any sheets or blankets that are too big for your bed. They should not hang down onto the floor. Have a firm chair that has side arms. You can use this for support while you get dressed. Do not have throw rugs and other things on the floor that can make you trip. What can I do in  the kitchen? Clean up any spills right away. Avoid walking on wet floors. Keep items that you use a lot in easy-to-reach places. If you need to reach something above you, use a strong step stool that has a grab bar. Keep electrical cords out of the way. Do not use floor polish or wax that makes floors slippery. If you must use wax, use non-skid floor wax. Do not have throw rugs and other things on the floor that can make you trip. What can I do with my stairs? Do not leave any items on the stairs. Make sure that there are handrails on both sides of the stairs and use them. Fix handrails that are broken or loose. Make sure that handrails are as long as the stairways. Check any carpeting to make sure that it is firmly attached to the stairs. Fix any carpet that is loose or worn. Avoid having throw rugs at the top or bottom of the stairs. If you do have throw rugs, attach them to the floor with carpet  tape. Make sure that you have a light switch at the top of the stairs and the bottom of the stairs. If you do not have them, ask someone to add them for you. What else can I do to help prevent falls? Wear shoes that: Do not have high heels. Have rubber bottoms. Are comfortable and fit you well. Are closed at the toe. Do not wear sandals. If you use a stepladder: Make sure that it is fully opened. Do not climb a closed stepladder. Make sure that both sides of the stepladder are locked into place. Ask someone to hold it for you, if possible. Clearly mark and make sure that you can see: Any grab bars or handrails. First and last steps. Where the edge of each step is. Use tools that help you move around (mobility aids) if they are needed. These include: Canes. Walkers. Scooters. Crutches. Turn on the lights when you go into a dark area. Replace any light bulbs as soon as they burn out. Set up your furniture so you have a clear path. Avoid moving your furniture around. If any of your floors are uneven, fix them. If there are any pets around you, be aware of where they are. Review your medicines with your doctor. Some medicines can make you feel dizzy. This can increase your chance of falling. Ask your doctor what other things that you can do to help prevent falls. This information is not intended to replace advice given to you by your health care provider. Make sure you discuss any questions you have with your health care provider. Document Released: 08/19/2009 Document Revised: 03/30/2016 Document Reviewed: 11/27/2014 Elsevier Interactive Patient Education  2017 [employer].

## 2023-01-24 IMAGING — MG MM DIGITAL SCREENING BILAT W/ TOMO AND CAD
8 series · 8 of 24 positions shown · non-contrast
Comparison: Previous exam(s).

CLINICAL DATA: Screening.

EXAM:
DIGITAL SCREENING BILATERAL MAMMOGRAM WITH TOMOSYNTHESIS AND CAD
TECHNIQUE: Bilateral screening digital craniocaudal and mediolateral oblique
mammograms were obtained. Bilateral screening digital breast
tomosynthesis was performed. The images were evaluated with
computer-aided detection.

[L CC synth-2D]
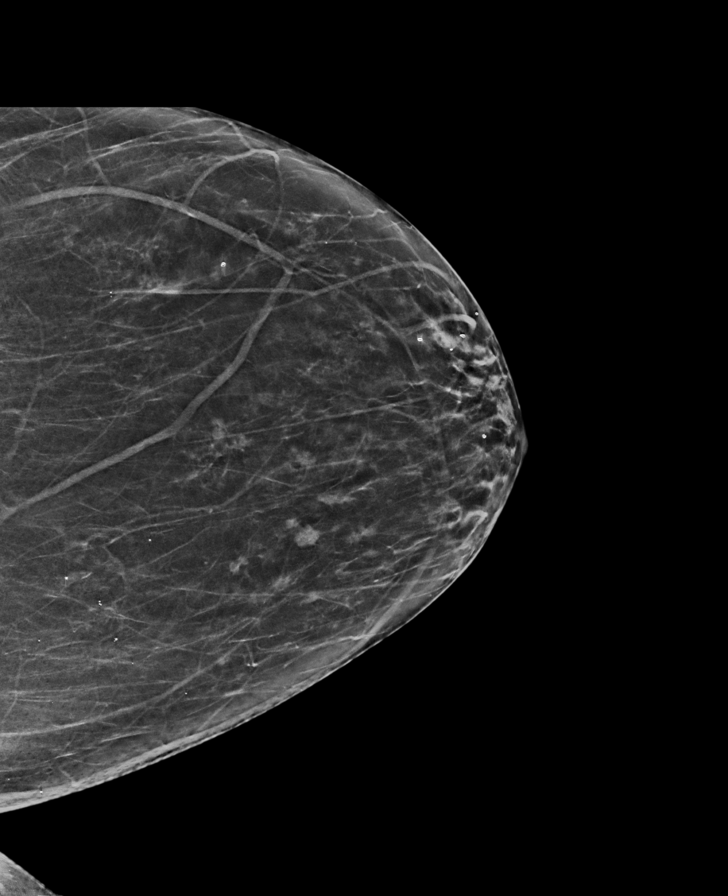

[R MLO synth-2D]
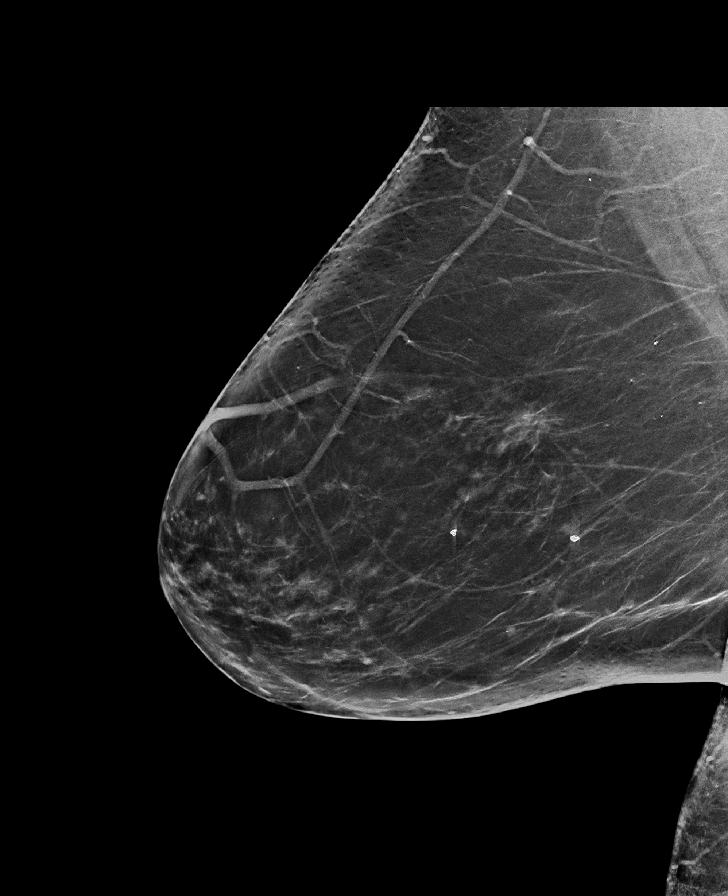

[R CC synth-2D]
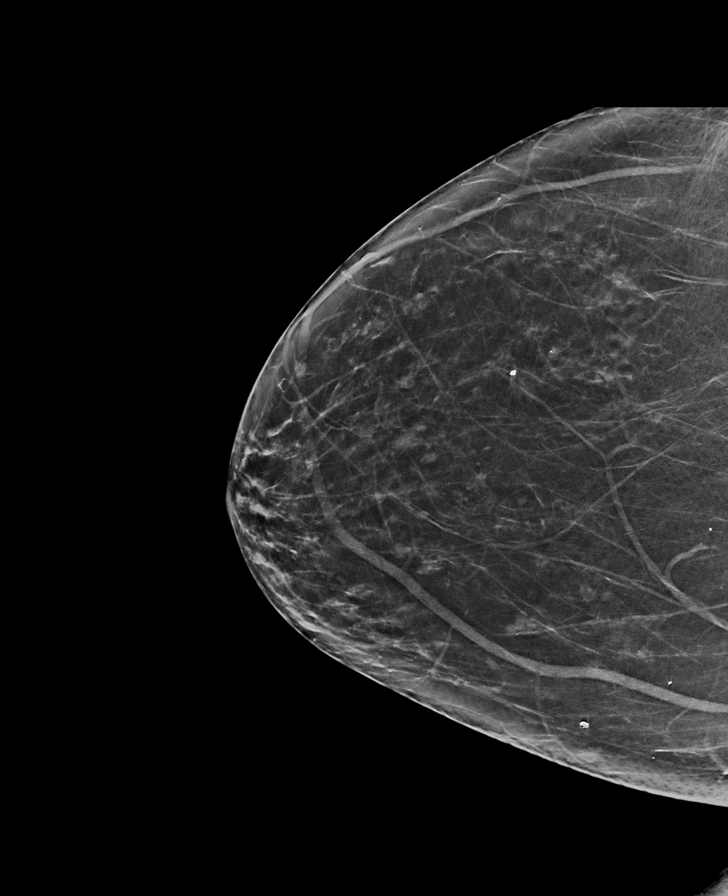

[L MLO synth-2D]
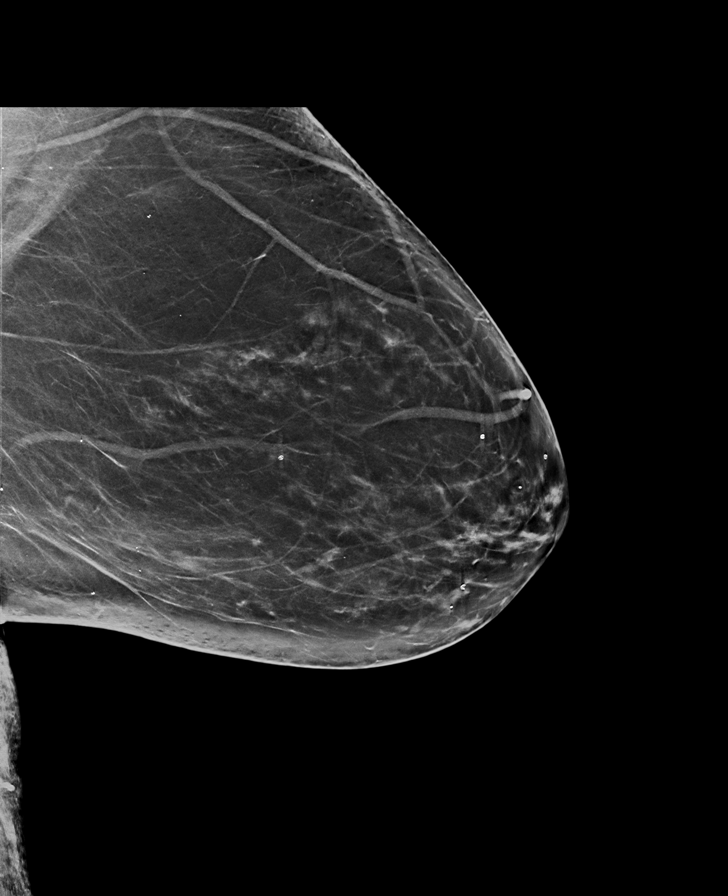

[L MLO tomo · tomo slice 41/81.0]
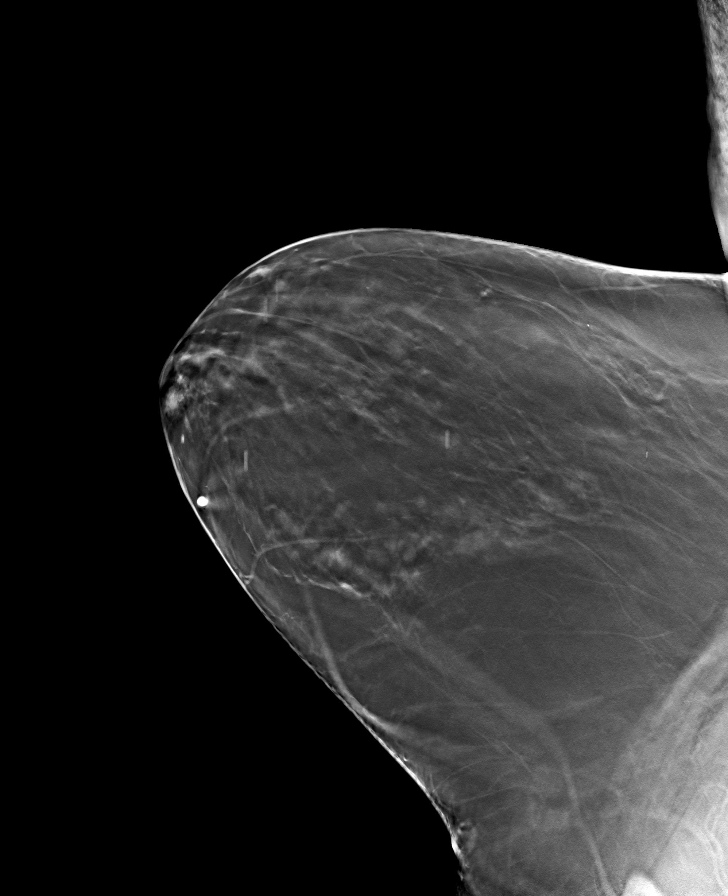

[L CC tomo · tomo slice 32/63.0]
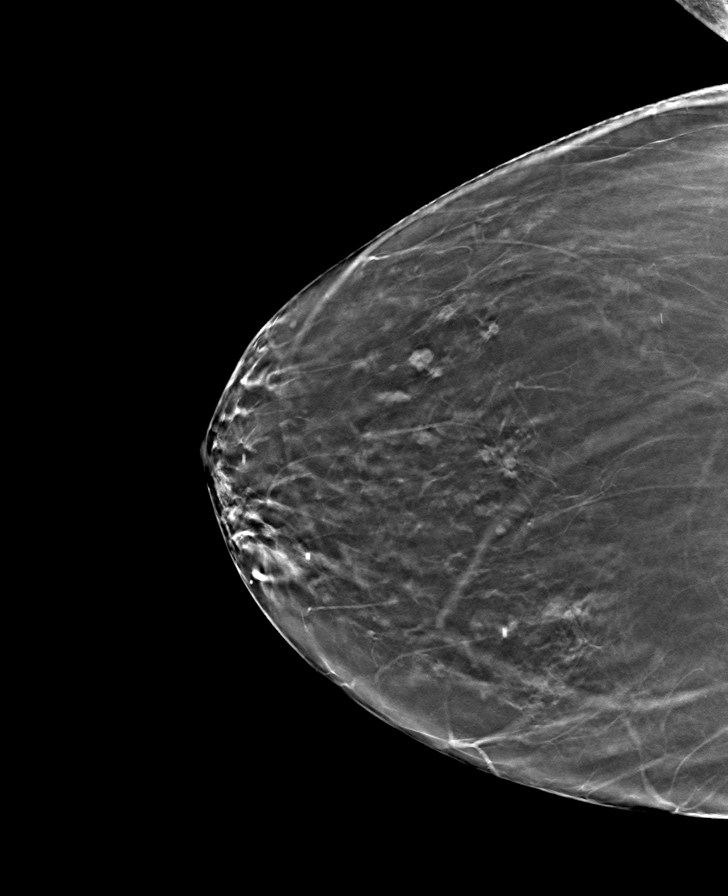

[R MLO tomo · tomo slice 39/78.0]
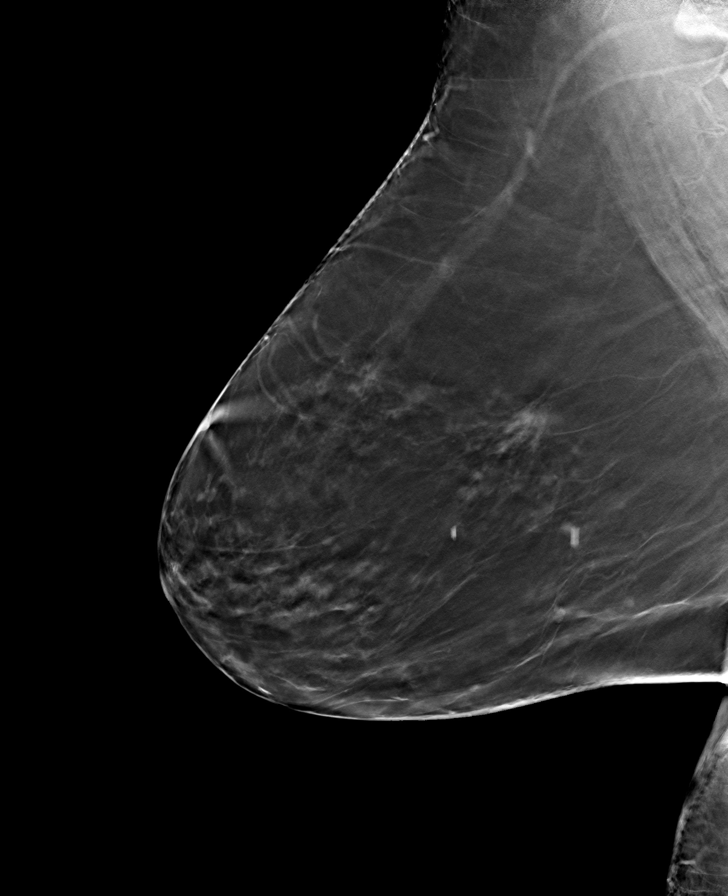

[R CC tomo · tomo slice 34/67.0]
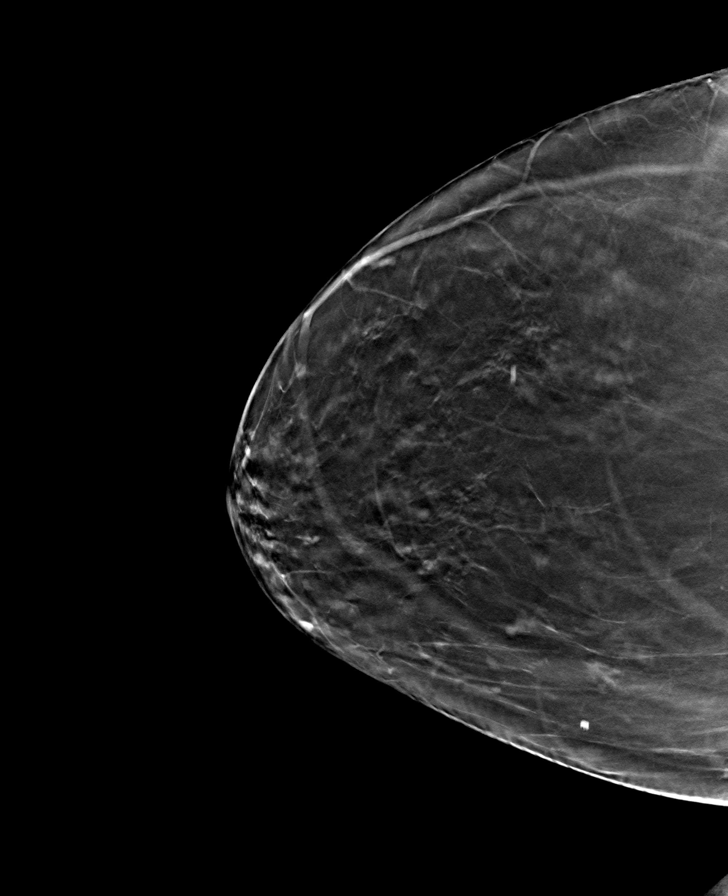

[8 of 24 positions shown; findings below may reference images not displayed]

ACR Breast Density Category b: There are scattered areas of
fibroglandular density.
FINDINGS: There are no findings suspicious for malignancy. There are multiple
round and oval masses which have waxed and waned since prior exam.
These are most consistent with benign cysts.
IMPRESSION: No mammographic evidence of malignancy. A result letter of this
screening mammogram will be mailed directly to the patient.

RECOMMENDATION:
Screening mammogram in one year. (Code:[id number])

BI-RADS CATEGORY  2: Benign.

## 2023-01-29 NOTE — Patient Instructions (Incomplete)
Read about SGL-2 agonist Marcelline Deist or Jardiance) or DPP4 - called Januvia   Preventive Care 78 Years and Older, Female Preventive care refers to lifestyle choices and visits with your health care provider that can promote health and wellness. Preventive care visits are also called wellness exams. What can I expect for my preventive care visit? Counseling Your health care provider may ask you questions about your: Medical history, including: Past medical problems. Family medical history. Pregnancy and menstrual history. History of falls. Current health, including: Memory and ability to understand (cognition). Emotional well-being. Home life and relationship well-being. Sexual activity and sexual health. Lifestyle, including: Alcohol, nicotine or tobacco, and drug use. Access to firearms. Diet, exercise, and sleep habits. Work and work Astronomer. Sunscreen use. Safety issues such as seatbelt and bike helmet use. Physical exam Your health care provider will check your: Height and weight. These may be used to calculate your BMI (body mass index). BMI is a measurement that tells if you are at a healthy weight. Waist circumference. This measures the distance around your waistline. This measurement also tells if you are at a healthy weight and may help predict your risk of certain diseases, such as type 2 diabetes and high blood pressure. Heart rate and blood pressure. Body temperature. Skin for abnormal spots. What immunizations do I need?  Vaccines are usually given at various ages, according to a schedule. Your health care provider will recommend vaccines for you based on your age, medical history, and lifestyle or other factors, such as travel or where you work. What tests do I need? Screening Your health care provider may recommend screening tests for certain conditions. This may include: Lipid and cholesterol levels. Hepatitis C test. Hepatitis B test. HIV (human  immunodeficiency virus) test. STI (sexually transmitted infection) testing, if you are at risk. Lung cancer screening. Colorectal cancer screening. Diabetes screening. This is done by checking your blood sugar (glucose) after you have not eaten for a while (fasting). Mammogram. Talk with your health care provider about how often you should have regular mammograms. BRCA-related cancer screening. This may be done if you have a family history of breast, ovarian, tubal, or peritoneal cancers. Bone density scan. This is done to screen for osteoporosis. Talk with your health care provider about your test results, treatment options, and if necessary, the need for more tests. Follow these instructions at home: Eating and drinking  Eat a diet that includes fresh fruits and vegetables, whole grains, lean protein, and low-fat dairy products. Limit your intake of foods with high amounts of sugar, saturated fats, and salt. Take vitamin and mineral supplements as recommended by your health care provider. Do not drink alcohol if your health care provider tells you not to drink. If you drink alcohol: Limit how much you have to 0-1 drink a day. Know how much alcohol is in your drink. In the [place] one drink equals one 12 oz bottle of beer (355 mL), one 5 oz glass of wine (148 mL), or one 1 oz glass of hard liquor (44 mL). Lifestyle Brush your teeth every morning and night with fluoride toothpaste. Floss one time each day. Exercise for at least 30 minutes 5 or more days each week. Do not use any products that contain nicotine or tobacco. These products include cigarettes, chewing tobacco, and vaping devices, such as e-cigarettes. If you need help quitting, ask your health care provider. Do not use drugs. If you are sexually active, practice safe sex. Use a condom or  other form of protection in order to prevent STIs. Take aspirin only as told by your health care provider. Make sure that you understand how much  to take and what form to take. Work with your health care provider to find out whether it is safe and beneficial for you to take aspirin daily. Ask your health care provider if you need to take a cholesterol-lowering medicine (statin). Find healthy ways to manage stress, such as: Meditation, yoga, or listening to music. Journaling. Talking to a trusted person. Spending time with friends and family. Minimize exposure to UV radiation to reduce your risk of skin cancer. Safety Always wear your seat belt while driving or riding in a vehicle. Do not drive: If you have been drinking alcohol. Do not ride with someone who has been drinking. When you are tired or distracted. While texting. If you have been using any mind-altering substances or drugs. Wear a helmet and other protective equipment during sports activities. If you have firearms in your house, make sure you follow all gun safety procedures. What's next? Visit your health care provider once a year for an annual wellness visit. Ask your health care provider how often you should have your eyes and teeth checked. Stay up to date on all vaccines. This information is not intended to replace advice given to you by your health care provider. Make sure you discuss any questions you have with your health care provider. Document Revised: 04/20/2021 Document Reviewed: 04/20/2021 Elsevier Patient Education  2023 [employer].

## 2023-01-29 NOTE — Progress Notes (Unsigned)
Name: Barbara Rivers   MRN: [id number]    DOB: 10-31-1956   Date:01/30/2023       Progress Note  Subjective  Chief Complaint  Annual Exam  HPI  Patient presents for annual CPE.  Diet: she cooks at home, balanced diet  Exercise: she uses her step machine at home , swims during the Summer  Last [place] Exam: up to date  Last Dental Exam: up to date   [place] from [facility]/28/2022 [facility]  AUDIT-C Score 0      Depression: Phq 9 is  negative    01/30/2023    8:58 AM 12/28/2022    1:29 PM 12/19/2022    8:34 AM 09/21/2022   11:27 AM 08/29/2022   11:01 AM  Depression screen PHQ 2/9  Decreased Interest 1 0 0 0 0  Down, Depressed, Hopeless 0 0 0 1 0  PHQ - 2 Score 1 0 0 1 0  Altered sleeping 0 0 0 0 0  Tired, decreased energy 1 0 0 1 0  Change in appetite 0 0 0 0 0  Feeling bad or failure about yourself  0 0 0 0 0  Trouble concentrating 0 0 0 0 0  Moving slowly or fidgety/restless 0 0 0 0 0  Suicidal thoughts 0 0 0 0 0  PHQ-9 Score 2 0 0 2 0  Difficult doing work/chores Not difficult at all       Hypertension: BP Readings from Last 3 Encounters:  01/30/23 126/70  09/21/22 124/72  08/29/22 132/72   Obesity: Wt Readings from Last 3 Encounters:  01/30/23 195 lb 6.4 oz (88.6 kg)  12/28/22 192 lb (87.1 kg)  12/19/22 199 lb (90.3 kg)   BMI Readings from Last 3 Encounters:  01/30/23 33.54 kg/m  12/28/22 32.96 kg/m  12/19/22 34.16 kg/m     Vaccines:   RSV: discussed with patient  Tdap: up to date Shingrix: up to date Pneumonia: up to date Flu: 2020, due COVID-19: refused    Hep C Screening: 07/20/16 STD testing and prevention (HIV/chl/gon/syphilis): N/A Intimate partner violence: negative screen  Sexual History : not sexually active due to husband's medical problems  Menstrual History/LMP/Abnormal Bleeding: post menopausal  Discussed importance of follow up if any post-menopausal bleeding: yes  Incontinence  Symptoms: negative for symptoms   Breast cancer:  - Last Mammogram: 07/13/22 - BRCA gene screening: N/A  Osteoporosis Prevention : Discussed high calcium and vitamin D supplementation, weight bearing exercises Bone density: 10/11/16, ordered 12/08/22 - she will schedule it   Cervical cancer screening: 06/12/19, due- ordered today  Skin cancer: she has two lesions and will see dermatologist next month  Colorectal cancer: 10/12/22  - repeat cologuard in 3 years  Lung cancer:  Low Dose CT Chest recommended if Age 9-80 years, 20 pack-year currently smoking OR have quit w/in 15years. Patient does not qualify for screen   ECG: 09/18/16  Advanced Care Planning: A voluntary discussion about advance care planning including the explanation and discussion of advance directives.  Discussed health care proxy and Living will, and the patient was able to identify a health care proxy as husband   Patient has  a living will and power of attorney of health care   Lipids: Lab Results  Component Value Date   CHOL 226 (H) 01/09/2022   CHOL 240 (H) 12/07/2020   CHOL 212 (H) 06/12/2019   Lab Results  Component Value Date   HDL 74  01/09/2022   HDL 76 12/07/2020   HDL 70 06/12/2019   Lab Results  Component Value Date   LDLCALC 123 (H) 01/09/2022   LDLCALC 137 (H) 12/07/2020   LDLCALC 121 (H) 06/12/2019   Lab Results  Component Value Date   TRIG 172 (H) 01/09/2022   TRIG 138 12/07/2020   TRIG 106 06/12/2019   Lab Results  Component Value Date   CHOLHDL 3.1 01/09/2022   CHOLHDL 3.2 12/07/2020   CHOLHDL 3.0 06/12/2019   No results found for: "LDLDIRECT"  Glucose: Glucose, Bld  Date Value Ref Range Status  01/09/2022 165 (H) 65 - 99 mg/dL Final    Comment:    .            Fasting reference interval . For someone without known diabetes, a glucose value >125 mg/dL indicates that they may have diabetes and this should be confirmed with a follow-up test. .   12/07/2020 113 (H) 65 - 99 mg/dL  Final    Comment:    .            Fasting reference interval . For someone without known diabetes, a glucose value between 100 and 125 mg/dL is consistent with prediabetes and should be confirmed with a follow-up test. .   06/12/2019 114 (H) 65 - 99 mg/dL Final    Comment:    .            Fasting reference interval . For someone without known diabetes, a glucose value between 100 and 125 mg/dL is consistent with prediabetes and should be confirmed with a follow-up test. .     Patient Active Problem List   Diagnosis Date Noted   Dyslipidemia due to type 2 diabetes mellitus ([place]) 08/28/2022   VV (varicose veins) 07/26/2016   Venous insufficiency of right leg 07/05/2016   Obesity (BMI 30.0-34.9) 07/05/2016   Elevated blood pressure 07/05/2016   Right knee pain 05/31/2016   Post-traumatic osteoarthritis of right knee 05/31/2016   Quadriceps tendon rupture 05/31/2016   CRPS type II 05/31/2016   Muscle wasting and atrophy, not elsewhere classified, right thigh 05/31/2016    Past Surgical History:  Procedure Laterality Date   CERVICAL CONIZATION W/BX     KNEE ARTHROSCOPY  02/23/2012   Procedure: ARTHROSCOPY KNEE;  Surgeon: Alta Corning, MD;  Location: [facility];  Service: Orthopedics;  Laterality: Right;  Lateral Retinacular Release, Chondroplasty Medial Femoral Condyle and Patella, Partial Lateral Meniscectomy   QUADRICEPS TENDON REPAIR  11/15/2012   Procedure: REPAIR QUADRICEP TENDON;  Surgeon: Alta Corning, MD;  Location: [facility];  Service: Orthopedics;  Laterality: Right;  WITH ALLOGRAFT   TONSILLECTOMY      Family History  Problem Relation Age of Onset   Diabetes Mother    Hypertension Mother    Congestive Heart Failure Mother    Diabetes Father    Kidney disease Father    Hypertension Father    Cancer Maternal Grandmother 6       Breast   Breast cancer Maternal Grandmother 1   Diabetes Paternal Grandmother     Social  History   Socioeconomic History   Marital status: Married    Spouse name: [name]   Number of children: 1   Years of education: Not on file   Highest education level: Not on file  Occupational History   Occupation: disabled    Comment: workman's comp   Tobacco Use   Smoking status: Never  Smokeless tobacco: Never  Vaping Use   Vaping Use: Never used  Substance and Sexual Activity   Alcohol use: Yes    Alcohol/week: 1.0 standard drink of alcohol    Types: 1 Glasses of wine per week    Comment: rarely   Drug use: No   Sexual activity: Never  Other Topics Concern   Not on file  Social History Narrative   She used to work but since 2013 , had a injury at work and one year later they found out that she had quad surgery, she still has weakness , daily pain and uses a cane. Fully disabled.    Husband also had a stroke in 2015  and works from home now    Social Determinants of [place] Strain: [place]  (01/30/2023)   Overall Financial Resource Strain (CARDIA)    Difficulty of Paying Living Expenses: Not hard at all  Food Insecurity: No Food Insecurity (01/30/2023)   Hunger Vital Sign    Worried About Running Out of Food in the Last Year: Never true    Ran Out of Food in the Last Year: Never true  Transportation Needs: No Transportation Needs (01/30/2023)   PRAPARE - Hydrologist (Medical): No    Lack of Transportation (Non-Medical): No  Physical Activity: Insufficiently Active (01/30/2023)   Exercise Vital Sign    Days of Exercise per Week: 3 days    Minutes of Exercise per Session: 30 min  Stress: Stress Concern Present (01/30/2023)   [place]    Feeling of Stress : Rather much  Social Connections: Moderately Isolated (01/30/2023)   Social Connection and Isolation Panel [NHANES]    Frequency of Communication with Friends and Family: More than three times a week     Frequency of Social Gatherings with Friends and Family: Three times a week    Attends Religious Services: Never    Active Member of Clubs or Organizations: No    Attends Archivist Meetings: Never    Marital Status: Married  Human resources officer Violence: Not At Risk (01/30/2023)   Humiliation, Afraid, Rape, and Kick questionnaire    Fear of Current or Ex-Partner: No    Emotionally Abused: No    Physically Abused: No    Sexually Abused: No     Current Outpatient Medications:    baclofen (LIORESAL) 10 MG tablet, Take 1 tablet (10 mg total) by mouth 3 (three) times daily as needed for muscle spasms., Disp: 90 tablet, Rfl: 1   diclofenac Sodium (VOLTAREN) 1 % GEL, Apply 4 g topically 4 (four) times daily., Disp: 300 g, Rfl: 1   famotidine (PEPCID) 20 MG tablet, TAKE 1 TABLET BY MOUTH TWICE A DAY AS NEEDED FOR HEARTBURN OR INDIGESTION, Disp: 90 tablet, Rfl: 0   Glucos-Chondroit-Collag-Hyal (JOINT SUPPORT FORMULA PO), Take by mouth., Disp: , Rfl:    Multiple Vitamin (MULTIVITAMIN) tablet, Take 1 tablet by mouth daily., Disp: , Rfl:    traMADol (ULTRAM) 50 MG tablet, Take 1 tablet (50 mg total) by mouth 3 times/day as needed-between meals & bedtime., Disp: 120 tablet, Rfl: 0  Allergies  Allergen Reactions   Penicillins Hives     ROS  Constitutional: Negative for fever, positive for  weight change.  Respiratory: Negative for cough and shortness of breath.   Cardiovascular: Negative for chest pain or palpitations.  Gastrointestinal: Negative for abdominal pain, no bowel changes.  Musculoskeletal:  positive  for gait problem and  joint swelling.  Skin: Negative for rash.  Neurological: Negative for dizziness or headache.  No other specific complaints in a complete review of systems (except as listed in HPI above).   Objective  Vitals:   01/30/23 0859  BP: 126/70  Pulse: 88  Resp: 16  Temp: 97.9 F (36.6 C)  TempSrc: Oral  SpO2: 99%  Weight: 195 lb 6.4 oz (88.6 kg)   Height: 5\' 4"  (1.626 m)    Body mass index is 33.54 kg/m.  Physical Exam  Constitutional: Patient appears well-developed and well-nourished. Obese  No distress.  HENT: Head: Normocephalic and atraumatic. Ears: B TMs ok, no erythema or effusion; Nose: Nose normal. Mouth/Throat: Oropharynx is clear and moist. No oropharyngeal exudate.  Eyes: Conjunctivae and EOM are normal. Pupils are equal, round, and reactive to light. No scleral icterus.  Neck: Normal range of motion. Neck supple. No JVD present. No thyromegaly present.  Cardiovascular: Normal rate, regular rhythm and normal heart sounds.  No murmur heard. No BLE edema. Pulmonary/Chest: Effort normal and breath sounds normal. No respiratory distress. Abdominal: Soft. Bowel sounds are normal, no distension. There is no tenderness. no masses Breast: no lumps or masses, no nipple discharge or rashes FEMALE GENITALIA:  External genitalia normal External urethra normal Vaginal vault normal without discharge or lesions Cervix normal without discharge or lesions Bimanual exam normal without masses RECTAL: not done  Musculoskeletal: decrease rom of right  knee  Neurological: he is alert and oriented to person, place, and time. No cranial nerve deficit. Coordination, balance, strength, speech and gait are normal.  Skin: raised lesion on left anterior chest and also has red spot on right lower leg  Psychiatric: Patient has a normal mood and affect. behavior is normal. Judgment and thought content normal.   Fall Risk:    01/30/2023    8:58 AM 12/28/2022    1:25 PM 12/19/2022    8:34 AM 09/21/2022   11:27 AM 08/29/2022   11:01 AM  Fall Risk   Falls in the past year? 0 0 0 0 0  Number falls in past yr:  0     Injury with Fall?  0     Risk for fall due to : Impaired balance/gait No Fall Risks No Fall Risks Impaired balance/gait Impaired balance/gait;Impaired mobility  Follow up Falls prevention discussed;Education provided;Falls evaluation  completed Education provided;Falls prevention discussed Falls prevention discussed Falls prevention discussed;Falls evaluation completed;Education provided Falls prevention discussed;Education provided;Falls evaluation completed     Functional Status Survey: Is the patient deaf or have difficulty hearing?: No Does the patient have difficulty seeing, even when wearing glasses/contacts?: No Does the patient have difficulty concentrating, remembering, or making decisions?: No Does the patient have difficulty walking or climbing stairs?: Yes Does the patient have difficulty dressing or bathing?: No Does the patient have difficulty doing errands alone such as visiting a doctor's office or shopping?: No   Assessment & Plan  1. Well adult exam  - MM 3D SCREENING MAMMOGRAM BILATERAL BREAST; Future  2. Cervical cancer screening  - Cytology - PAP  3. Dyslipidemia due to type 2 diabetes mellitus (HCC)  - COMPLETE METABOLIC PANEL WITH GFR - Urine Microalbumin w/creat. ratio - Lipid panel - Hemoglobin A1c  4. Long-term use of high-risk medication  - COMPLETE METABOLIC PANEL WITH GFR - CBC with Differential/Platelet  5. Encounter for screening mammogram for breast cancer  - MM 3D SCREENING MAMMOGRAM BILATERAL BREAST; Future    -  USPSTF grade A and B recommendations reviewed with patient; age-appropriate recommendations, preventive care, screening tests, etc discussed and encouraged; healthy living encouraged; see AVS for patient education given to patient -Discussed importance of 150 minutes of physical activity weekly, eat two servings of fish weekly, eat one serving of tree nuts ( cashews, pistachios, pecans, almonds.Marland Kitchen) every other day, eat 6 servings of fruit/vegetables daily and drink plenty of water and avoid sweet beverages.   -Reviewed Health Maintenance: Yes.

## 2023-01-30 ENCOUNTER — Ambulatory Visit (INDEPENDENT_AMBULATORY_CARE_PROVIDER_SITE_OTHER): Payer: Medicare HMO | Admitting: Family Medicine

## 2023-01-30 ENCOUNTER — Encounter: Payer: Self-pay | Admitting: Family Medicine

## 2023-01-30 ENCOUNTER — Other Ambulatory Visit (HOSPITAL_COMMUNITY)
Admission: RE | Admit: 2023-01-30 | Discharge: 2023-01-30 | Disposition: A | Discharge status: Home / Self Care | Payer: Medicare HMO | Source: Ambulatory Visit | Attending: Family Medicine | Admitting: Family Medicine

## 2023-01-30 ENCOUNTER — Other Ambulatory Visit: Payer: Self-pay

## 2023-01-30 VITALS — BP 126/70 | HR 88 | Temp 97.9°F | Resp 16 | Ht 64.0 in | Wt 195.4 lb

## 2023-01-30 DIAGNOSIS — E1169 Type 2 diabetes mellitus with other specified complication: Secondary | ICD-10-CM | POA: Diagnosis not present

## 2023-01-30 DIAGNOSIS — E785 Hyperlipidemia, unspecified: Secondary | ICD-10-CM | POA: Diagnosis not present

## 2023-01-30 DIAGNOSIS — M62551 Muscle wasting and atrophy, not elsewhere classified, right thigh: Secondary | ICD-10-CM

## 2023-01-30 DIAGNOSIS — Z124 Encounter for screening for malignant neoplasm of cervix: Secondary | ICD-10-CM

## 2023-01-30 DIAGNOSIS — R69 Illness, unspecified: Secondary | ICD-10-CM | POA: Diagnosis not present

## 2023-01-30 DIAGNOSIS — Z1231 Encounter for screening mammogram for malignant neoplasm of breast: Secondary | ICD-10-CM

## 2023-01-30 DIAGNOSIS — Z1151 Encounter for screening for human papillomavirus (HPV): Secondary | ICD-10-CM | POA: Insufficient documentation

## 2023-01-30 DIAGNOSIS — Z79899 Other long term (current) drug therapy: Secondary | ICD-10-CM | POA: Diagnosis not present

## 2023-01-30 DIAGNOSIS — R252 Cramp and spasm: Secondary | ICD-10-CM

## 2023-01-30 DIAGNOSIS — Z01419 Encounter for gynecological examination (general) (routine) without abnormal findings: Secondary | ICD-10-CM | POA: Diagnosis present

## 2023-01-30 DIAGNOSIS — G8929 Other chronic pain: Secondary | ICD-10-CM

## 2023-01-30 DIAGNOSIS — Z0000 Encounter for general adult medical examination without abnormal findings: Secondary | ICD-10-CM

## 2023-01-30 MED ORDER — BACLOFEN 10 MG PO TABS: 10.0000 mg | ORAL_TABLET | Freq: Three times a day (TID) | ORAL | 0 refills | Status: DC | PRN

## 2023-01-31 LAB — CBC WITH DIFFERENTIAL/PLATELET
Absolute Monocytes: 663 cells/uL (ref 200–950)
Basophils Absolute: 78 cells/uL (ref 0–200)
Basophils Relative: 1.2 %
Eosinophils Absolute: 150 cells/uL (ref 15–500)
Eosinophils Relative: 2.3 %
HCT: 45.4 % — ABNORMAL HIGH (ref 35.0–45.0)
Hemoglobin: 15 g/dL (ref 11.7–15.5)
Lymphs Abs: 1905 cells/uL (ref 850–3900)
MCH: 29.5 pg (ref 27.0–33.0)
MCHC: 33 g/dL (ref 32.0–36.0)
MCV: 89.2 fL (ref 80.0–100.0)
MPV: 12.3 fL (ref 7.5–12.5)
Monocytes Relative: 10.2 %
Neutro Abs: 3705 cells/uL (ref 1500–7800)
Neutrophils Relative %: 57 %
Platelets: 339 10*3/uL (ref 140–400)
RBC: 5.09 10*6/uL (ref 3.80–5.10)
RDW: 11.8 % (ref 11.0–15.0)
Total Lymphocyte: 29.3 %
WBC: 6.5 10*3/uL (ref 3.8–10.8)

## 2023-01-31 LAB — LIPID PANEL
Cholesterol: 215 mg/dL — ABNORMAL HIGH (ref <200)
HDL: 75 mg/dL (ref >=50)
LDL Cholesterol (Calc): 115 mg/dL (calc) — ABNORMAL HIGH
Non-HDL Cholesterol (Calc): 140 mg/dL (calc) — ABNORMAL HIGH (ref <130)
Total CHOL/HDL Ratio: 2.9 (calc) (ref <5.0)
Triglycerides: 136 mg/dL (ref <150)

## 2023-01-31 LAB — COMPLETE METABOLIC PANEL WITH GFR
AG Ratio: 1.3 (calc) (ref 1.0–2.5)
ALT: 27 U/L (ref 6–29)
AST: 23 U/L (ref 10–35)
Albumin: 4.3 g/dL (ref 3.6–5.1)
Alkaline phosphatase (APISO): 132 U/L (ref 37–153)
BUN: 12 mg/dL (ref 7–25)
CO2: 26 mmol/L (ref 20–32)
Calcium: 9.8 mg/dL (ref 8.6–10.4)
Chloride: 102 mmol/L (ref 98–110)
Creat: 0.72 mg/dL (ref 0.50–1.05)
Globulin: 3.3 g/dL (calc) (ref 1.9–3.7)
Glucose, Bld: 157 mg/dL — ABNORMAL HIGH (ref 65–99)
Potassium: 4.4 mmol/L (ref 3.5–5.3)
Sodium: 138 mmol/L (ref 135–146)
Total Bilirubin: 0.5 mg/dL (ref 0.2–1.2)
Total Protein: 7.6 g/dL (ref 6.1–8.1)
eGFR: 93 mL/min/{1.73_m2} (ref >=60)

## 2023-01-31 LAB — HEMOGLOBIN A1C
Hgb A1c MFr Bld: 7.7 % of total Hgb — ABNORMAL HIGH (ref <5.7)
Mean Plasma Glucose: 174 mg/dL
eAG (mmol/L): 9.7 mmol/L

## 2023-01-31 LAB — MICROALBUMIN / CREATININE URINE RATIO
Creatinine, Urine: 188 mg/dL (ref 20–275)
Microalb Creat Ratio: 10 mg/g creat (ref <30)
Microalb, Ur: 1.8 mg/dL

## 2023-01-31 LAB — CYTOLOGY - PAP
Comment: NEGATIVE
Diagnosis: NEGATIVE
High risk HPV: NEGATIVE

## 2023-02-14 ENCOUNTER — Encounter: Payer: Self-pay | Admitting: Dermatology

## 2023-02-14 ENCOUNTER — Ambulatory Visit: Payer: Medicare HMO | Admitting: Dermatology

## 2023-02-14 VITALS — BP 146/79 | HR 81

## 2023-02-14 DIAGNOSIS — L570 Actinic keratosis: Secondary | ICD-10-CM

## 2023-02-14 DIAGNOSIS — D485 Neoplasm of uncertain behavior of skin: Secondary | ICD-10-CM | POA: Diagnosis not present

## 2023-02-14 DIAGNOSIS — L814 Other melanin hyperpigmentation: Secondary | ICD-10-CM | POA: Diagnosis not present

## 2023-02-14 DIAGNOSIS — L578 Other skin changes due to chronic exposure to nonionizing radiation: Secondary | ICD-10-CM

## 2023-02-14 HISTORY — DX: Actinic keratosis: L57.0

## 2023-02-14 NOTE — Progress Notes (Unsigned)
Name: Barbara Rivers   MRN: [id number]    DOB: 01-27-1956   Date:02/14/2023       Progress Note  Subjective  Chief Complaint  Follow Up  I connected with  Barbara Rivers  on 02/14/23 at 11:00 AM EDT by a video enabled telemedicine application and verified that I am speaking with the correct person using two identifiers.  I discussed the limitations of evaluation and management by telemedicine and the availability of in person appointments. The patient expressed understanding and agreed to proceed with the virtual visit  Staff also discussed with the patient that there may be a patient responsible charge related to this service. Patient Location: at home  Provider Location: [facility] Additional Individuals present: alone   HPI  Diabetes type XN:[id number] 06/12/2019 with A1C 6.7 %, last visit A1C was 7.2 % it was down to 7 % , level went up to 7.7 %  she had stopped taking Metformin 3 weeks prior to her last visit due to upset stomach. She states she has resumed medication since upset stomach stopped once she stopped taking NSAID's. She has associated dyslipidemia, but she refuses statin therapy. She denies polyphagia, polydipsia or polyuria . She checked price for other medications and it was too expensive    Dyslipidemia: she refuses statin therapy. Explained risk of heart attacks and strokes, discussed risk below . Reminded her that Zetia is not a statin , she will check coverage with insurance    The 10-year ASCVD risk score (Arnett DK, et al., 2019) is: 12.2%   Values used to calculate the score:     Age: 67 years     Sex: Female     Is Non-Hispanic African American: No     Diabetic: Yes     Tobacco smoker: No     Systolic Blood Pressure: 146 mmHg     Is BP treated: No     HDL Cholesterol: 75 mg/dL     Total Cholesterol: 215 mg/dL    Indigestion: she states that when her husband and her mother demand of her she feels stressed, she develops indigestion, causes her to have increase in  eructation , improves with tums and mylanta and avoids spicy food. She has some Famotine at home, but states since she stopped taking NSAID's symptoms improved   Patient Active Problem List   Diagnosis Date Noted   Dyslipidemia due to type 2 diabetes mellitus 08/28/2022   VV (varicose veins) 07/26/2016   Venous insufficiency of right leg 07/05/2016   Obesity (BMI 30.0-34.9) 07/05/2016   Elevated blood pressure 07/05/2016   Right knee pain 05/31/2016   Post-traumatic osteoarthritis of right knee 05/31/2016   Quadriceps tendon rupture 05/31/2016   CRPS type II 05/31/2016   Muscle wasting and atrophy, not elsewhere classified, right thigh 05/31/2016    Past Surgical History:  Procedure Laterality Date   CERVICAL CONIZATION W/BX     KNEE ARTHROSCOPY  02/23/2012   Procedure: ARTHROSCOPY KNEE;  Surgeon: Harvie Junior, MD;  Location: [facility];  Service: Orthopedics;  Laterality: Right;  Lateral Retinacular Release, Chondroplasty Medial Femoral Condyle and Patella, Partial Lateral Meniscectomy   QUADRICEPS TENDON REPAIR  11/15/2012   Procedure: REPAIR QUADRICEP TENDON;  Surgeon: Harvie Junior, MD;  Location: [facility];  Service: Orthopedics;  Laterality: Right;  WITH ALLOGRAFT   TONSILLECTOMY      Family History  Problem Relation Age of Onset   Diabetes Mother    Hypertension  Mother    Congestive Heart Failure Mother    Diabetes Father    Kidney disease Father    Hypertension Father    Cancer Maternal Grandmother 30       Breast   Breast cancer Maternal Grandmother 31   Diabetes Paternal Grandmother     Social History   Socioeconomic History   Marital status: Married    Spouse name: Greggory Stallion   Number of children: 1   Years of education: Not on file   Highest education level: Not on file  Occupational History   Occupation: disabled    Comment: workman's comp   Tobacco Use   Smoking status: Never   Smokeless tobacco: Never  Vaping Use   Vaping  Use: Never used  Substance and Sexual Activity   Alcohol use: Yes    Alcohol/week: 1.0 standard drink of alcohol    Types: 1 Glasses of wine per week    Comment: rarely   Drug use: No   Sexual activity: Never  Other Topics Concern   Not on file  Social History Narrative   She used to work but since 2013 , had a injury at work and one year later they found out that she had quad surgery, she still has weakness , daily pain and uses a cane. Fully disabled.    Husband also had a stroke in 2015  and works from home now    Social Determinants of Health   Financial Resource Strain: Low Risk  (01/30/2023)   Overall Financial Resource Strain (CARDIA)    Difficulty of Paying Living Expenses: Not hard at all  Food Insecurity: No Food Insecurity (01/30/2023)   Hunger Vital Sign    Worried About Running Out of Food in the Last Year: Never true    Ran Out of Food in the Last Year: Never true  Transportation Needs: No Transportation Needs (01/30/2023)   PRAPARE - Administrator, Civil Service (Medical): No    Lack of Transportation (Non-Medical): No  Physical Activity: Insufficiently Active (01/30/2023)   Exercise Vital Sign    Days of Exercise per Week: 3 days    Minutes of Exercise per Session: 30 min  Stress: Stress Concern Present (01/30/2023)   [employer] of Occupational Health - Occupational Stress Questionnaire    Feeling of Stress : Rather much  Social Connections: Moderately Isolated (01/30/2023)   Social Connection and Isolation Panel [NHANES]    Frequency of Communication with Friends and Family: More than three times a week    Frequency of Social Gatherings with Friends and Family: Three times a week    Attends Religious Services: Never    Active Member of Clubs or Organizations: No    Attends Banker Meetings: Never    Marital Status: Married  Catering manager Violence: Not At Risk (01/30/2023)   Humiliation, Afraid, Rape, and Kick questionnaire     Fear of Current or Ex-Partner: No    Emotionally Abused: No    Physically Abused: No    Sexually Abused: No     Current Outpatient Medications:    baclofen (LIORESAL) 10 MG tablet, Take 1 tablet (10 mg total) by mouth 3 (three) times daily as needed for muscle spasms., Disp: 90 tablet, Rfl: 0   diclofenac Sodium (VOLTAREN) 1 % GEL, Apply 4 g topically 4 (four) times daily., Disp: 300 g, Rfl: 1   famotidine (PEPCID) 20 MG tablet, TAKE 1 TABLET BY MOUTH TWICE A DAY AS NEEDED FOR  HEARTBURN OR INDIGESTION, Disp: 90 tablet, Rfl: 0   Glucos-Chondroit-Collag-Hyal (JOINT SUPPORT FORMULA PO), Take by mouth., Disp: , Rfl:    metFORMIN (GLUCOPHAGE-XR) 500 MG 24 hr tablet, Take 500 mg by mouth daily with breakfast., Disp: , Rfl:    Multiple Vitamin (MULTIVITAMIN) tablet, Take 1 tablet by mouth daily., Disp: , Rfl:    traMADol (ULTRAM) 50 MG tablet, Take 1 tablet (50 mg total) by mouth 3 times/day as needed-between meals & bedtime., Disp: 120 tablet, Rfl: 0  Allergies  Allergen Reactions   Penicillins Hives    I personally reviewed active problem list, medication list, allergies, family history, social history, health maintenance with the patient/caregiver today.   ROS  Ten systems reviewed and is negative except as mentioned in HPI   Objective  Virtual encounter, vitals not obtained.  There is no height or weight on file to calculate BMI.  Physical Exam  Awake, alert and oriented  PHQ2/9:    01/30/2023    8:58 AM 12/28/2022    1:29 PM 12/19/2022    8:34 AM 09/21/2022   11:27 AM 08/29/2022   11:01 AM  Depression screen PHQ 2/9  Decreased Interest 1 0 0 0 0  Down, Depressed, Hopeless 0 0 0 1 0  PHQ - 2 Score 1 0 0 1 0  Altered sleeping 0 0 0 0 0  Tired, decreased energy 1 0 0 1 0  Change in appetite 0 0 0 0 0  Feeling bad or failure about yourself  0 0 0 0 0  Trouble concentrating 0 0 0 0 0  Moving slowly or fidgety/restless 0 0 0 0 0  Suicidal thoughts 0 0 0 0 0  PHQ-9 Score  2 0 0 2 0  Difficult doing work/chores Not difficult at all       PHQ-2/9 Result is negative.    Fall Risk:    01/30/2023    8:58 AM 12/28/2022    1:25 PM 12/19/2022    8:34 AM 09/21/2022   11:27 AM 08/29/2022   11:01 AM  Fall Risk   Falls in the past year? 0 0 0 0 0  Number falls in past yr:  0     Injury with Fall?  0     Risk for fall due to : Impaired balance/gait No Fall Risks No Fall Risks Impaired balance/gait Impaired balance/gait;Impaired mobility  Follow up Falls prevention discussed;Education provided;Falls evaluation completed Education provided;Falls prevention discussed Falls prevention discussed Falls prevention discussed;Falls evaluation completed;Education provided Falls prevention discussed;Education provided;Falls evaluation completed     Assessment & Plan  1. Dyslipidemia due to type 2 diabetes mellitus  She has been taking Metformin and we will recheck level in 3 more months  2. Dyslipidemia  She will check coverage of Zetia  3. Gastroesophageal reflux disease without esophagitis   She will try a probiotic, doing better since she stopped otc nsaid's    I discussed the assessment and treatment plan with the patient. The patient was provided an opportunity to ask questions and all were answered. The patient agreed with the plan and demonstrated an understanding of the instructions.  The patient was advised to call back or seek an in-person evaluation if the symptoms worsen or if the condition fails to improve as anticipated.  I provided 15  minutes of non-face-to-face time during this encounter.

## 2023-02-14 NOTE — Patient Instructions (Addendum)
Wound Care Instructions  Cleanse wound gently with soap and water once a day then pat dry with clean gauze. Apply a thin coat of Petrolatum (petroleum jelly, "Vaseline") over the wound (unless you have an allergy to this). We recommend that you use a new, sterile tube of Vaseline. Do not pick or remove scabs. Do not remove the yellow or white "healing tissue" from the base of the wound.  Cover the wound with fresh, clean, nonstick gauze and secure with paper tape. You may use Band-Aids in place of gauze and tape if the wound is small enough, but would recommend trimming much of the tape off as there is often too much. Sometimes Band-Aids can irritate the skin.  You should call the office for your biopsy report after 1 week if you have not already been contacted.  If you experience any problems, such as abnormal amounts of bleeding, swelling, significant bruising, significant pain, or evidence of infection, please call the office immediately.  FOR ADULT SURGERY PATIENTS: If you need something for pain relief you may take 1 extra strength Tylenol (acetaminophen) AND 2 Ibuprofen (200mg each) together every 4 hours as needed for pain. (do not take these if you are allergic to them or if you have a reason you should not take them.) Typically, you may only need pain medication for 1 to 3 days.       Recommend daily broad spectrum sunscreen SPF 30+ to sun-exposed areas, reapply every 2 hours as needed. Call for new or changing lesions.  Staying in the shade or wearing long sleeves, sun glasses (UVA+UVB protection) and wide brim hats (4-inch brim around the entire circumference of the hat) are also recommended for sun protection.     Due to recent changes in healthcare laws, you may see results of your pathology and/or laboratory studies on MyChart before the doctors have had a chance to review them. We understand that in some cases there may be results that are confusing or concerning to you. Please  understand that not all results are received at the same time and often the doctors may need to interpret multiple results in order to provide you with the best plan of care or course of treatment. Therefore, we ask that you please give us 2 business days to thoroughly review all your results before contacting the office for clarification. Should we see a critical lab result, you will be contacted sooner.   If You Need Anything After Your Visit  If you have any questions or concerns for your doctor, please call our main line at [phone] and press option 4 to reach your doctor's medical assistant. If no one answers, please leave a voicemail as directed and we will return your call as soon as possible. Messages left after 4 pm will be answered the following business day.   You may also send us a message via MyChart. We typically respond to MyChart messages within 1-2 business days.  For prescription refills, please ask your pharmacy to contact our office. Our fax number is [phone].  If you have an urgent issue when the clinic is closed that cannot wait until the next business day, you can page your doctor at the number below.    Please note that while we do our best to be available for urgent issues outside of office hours, we are not available 24/7.   If you have an urgent issue and are unable to reach us, you may choose to seek medical care   at your doctor's office, retail clinic, urgent care center, or emergency room.  If you have a medical emergency, please immediately call 911 or go to the emergency department.  Pager Numbers  - Dr. Kowalski: [phone]  - Dr. Moye: [phone]  - Dr. Stewart: [phone]  In the event of inclement weather, please call our main line at [phone] for an update on the status of any delays or closures.  Dermatology Medication Tips: Please keep the boxes that topical medications come in in order to help keep track of the instructions about  where and how to use these. Pharmacies typically print the medication instructions only on the boxes and not directly on the medication tubes.   If your medication is too expensive, please contact our office at [phone] option 4 or send us a message through MyChart.   We are unable to tell what your co-pay for medications will be in advance as this is different depending on your insurance coverage. However, we may be able to find a substitute medication at lower cost or fill out paperwork to get insurance to cover a needed medication.   If a prior authorization is required to get your medication covered by your insurance company, please allow us 1-2 business days to complete this process.  Drug prices often vary depending on where the prescription is filled and some pharmacies may offer cheaper prices.  The website www.goodrx.com contains coupons for medications through different pharmacies. The prices here do not account for what the cost may be with help from insurance (it may be cheaper with your insurance), but the website can give you the price if you did not use any insurance.  - You can print the associated coupon and take it with your prescription to the pharmacy.  - You may also stop by our office during regular business hours and pick up a GoodRx coupon card.  - If you need your prescription sent electronically to a different pharmacy, notify our office through [facility] MyChart or by phone at [phone] option 4.     Si Usted Necesita Algo Despus de Su Visita  Tambin puede enviarnos un mensaje a travs de MyChart. Por lo general respondemos a los mensajes de MyChart en el transcurso de 1 a 2 das hbiles.  Para renovar recetas, por favor pida a su farmacia que se ponga en contacto con nuestra oficina. Nuestro nmero de fax es el [phone].  Si tiene un asunto urgente cuando la clnica est cerrada y que no puede esperar hasta el siguiente da hbil, puede  llamar/localizar a su doctor(a) al nmero que aparece a continuacin.   Por favor, tenga en cuenta que aunque hacemos todo lo posible para estar disponibles para asuntos urgentes fuera del horario de oficina, no estamos disponibles las 24 horas del da, los 7 das de la semana.   Si tiene un problema urgente y no puede comunicarse con nosotros, puede optar por buscar atencin mdica  en el consultorio de su doctor(a), en una clnica privada, en un centro de atencin urgente o en una sala de emergencias.  Si tiene una emergencia mdica, por favor llame inmediatamente al 911 o vaya a la sala de emergencias.  Nmeros de bper  - Dr. Kowalski: [phone]  - Dra. Moye: [phone]  - Dra. Stewart: [phone]  En caso de inclemencias del tiempo, por favor llame a nuestra lnea principal al [phone] para una actualizacin sobre el estado de cualquier retraso o cierre.  Consejos para la medicacin en   dermatologa: Por favor, guarde las cajas en las que vienen los medicamentos de uso tpico para ayudarle a seguir las instrucciones sobre dnde y cmo usarlos. Las farmacias generalmente imprimen las instrucciones del medicamento slo en las cajas y no directamente en los tubos del medicamento.   Si su medicamento es muy caro, por favor, pngase en contacto con nuestra oficina llamando al [phone] y presione la opcin 4 o envenos un mensaje a travs de MyChart.   No podemos decirle cul ser su copago por los medicamentos por adelantado ya que esto es diferente dependiendo de la cobertura de su seguro. Sin embargo, es posible que podamos encontrar un medicamento sustituto a menor costo o llenar un formulario para que el seguro cubra el medicamento que se considera necesario.   Si se requiere una autorizacin previa para que su compaa de seguros cubra su medicamento, por favor permtanos de 1 a 2 das hbiles para completar este proceso.  Los precios de los medicamentos varan con  frecuencia dependiendo del lugar de dnde se surte la receta y alguna farmacias pueden ofrecer precios ms baratos.  El sitio web www.goodrx.com tiene cupones para medicamentos de diferentes farmacias. Los precios aqu no tienen en cuenta lo que podra costar con la ayuda del seguro (puede ser ms barato con su seguro), pero el sitio web puede darle el precio si no utiliz ningn seguro.  - Puede imprimir el cupn correspondiente y llevarlo con su receta a la farmacia.  - Tambin puede pasar por nuestra oficina durante el horario de atencin regular y recoger una tarjeta de cupones de GoodRx.  - Si necesita que su receta se enve electrnicamente a una farmacia diferente, informe a nuestra oficina a travs de MyChart de [place] o por telfono llamando al [phone] y presione la opcin 4.  

## 2023-02-14 NOTE — Progress Notes (Signed)
   New Patient Visit   Subjective  Barbara Rivers is a 67 y.o. female who presents for the following: Spots on the left chest, present since last summer, itchy and had bled in the past. She also has a spot on her right lower leg that has gotten bigger, tender over the past year.   The patient has spots, moles and lesions to be evaluated, some may be new or changing.     The following portions of the chart were reviewed this encounter and updated as appropriate: medications, allergies, medical history  Review of Systems:  No other skin or systemic complaints except as noted in HPI or Assessment and Plan.  Objective  Well appearing patient in no apparent distress; mood and affect are within normal limits.  A focused examination was performed of the following areas: Face, chest, leg Relevant physical exam findings are noted in the Assessment and Plan.  Right Lower Pretibia 2.5 x 1.5 CM pink smooth lobulated plaque      Left Chest 0.6 CM violaceous domed papule       Assessment & Plan   Neoplasm of uncertain behavior of skin (2) Right Lower Pretibia  Skin / nail biopsy Type of biopsy: tangential   Informed consent: discussed and consent obtained   Patient was prepped and draped in usual sterile fashion: Area prepped with alcohol. Anesthesia: the lesion was anesthetized in a standard fashion   Anesthetic:  1% lidocaine w/ epinephrine 1-100,000 buffered w/ 8.4% NaHCO3 Instrument used: flexible razor blade   Hemostasis achieved with: pressure, aluminum chloride and electrodesiccation   Outcome: patient tolerated procedure well   Post-procedure details: wound care instructions given   Post-procedure details comment:  Ointment and small bandage applied  Specimen 1 - Surgical pathology Differential Diagnosis: Inflamed SK r/o BCC/SCC Check Margins: No   Left Chest  Epidermal / dermal shaving  Lesion diameter (cm):  0.6 Informed consent: discussed and consent  obtained   Patient was prepped and draped in usual sterile fashion: Area prepped with alcohol. Anesthesia: the lesion was anesthetized in a standard fashion   Anesthetic:  1% lidocaine w/ epinephrine 1-100,000 buffered w/ 8.4% NaHCO3 Instrument used: flexible razor blade   Hemostasis achieved with: pressure, aluminum chloride and electrodesiccation   Outcome: patient tolerated procedure well   Post-procedure details: wound care instructions given   Post-procedure details comment:  Ointment and small bandage applied  Specimen 2 - Surgical pathology Differential Diagnosis: Irritated Hemangioma vs Pyogenic Granuloma Check Margins: No  ACTINIC DAMAGE - chronic, secondary to cumulative UV radiation exposure/sun exposure over time - diffuse scaly erythematous macules with underlying dyspigmentation - Recommend daily broad spectrum sunscreen SPF 30+ to sun-exposed areas, reapply every 2 hours as needed.  - Recommend staying in the shade or wearing long sleeves, sun glasses (UVA+UVB protection) and wide brim hats (4-inch brim around the entire circumference of the hat). - Call for new or changing lesions.  LENTIGINES Exam: scattered tan macules Due to sun exposure Treatment Plan: Benign-appearing, observe. Recommend daily broad spectrum sunscreen SPF 30+ to sun-exposed areas, reapply every 2 hours as needed.  Call for any changes    Return if symptoms worsen or fail to improve.  Barbara Rivers, CMA, am acting as scribe for [name], MD .   Documentation: I have reviewed the above documentation for accuracy and completeness, and I agree with the above.  [name], MD

## 2023-02-15 ENCOUNTER — Encounter: Payer: Self-pay | Admitting: Family Medicine

## 2023-02-15 ENCOUNTER — Telehealth (INDEPENDENT_AMBULATORY_CARE_PROVIDER_SITE_OTHER): Payer: Medicare HMO | Admitting: Family Medicine

## 2023-02-15 VITALS — Ht 64.0 in | Wt 195.0 lb

## 2023-02-15 DIAGNOSIS — E785 Hyperlipidemia, unspecified: Secondary | ICD-10-CM | POA: Diagnosis not present

## 2023-02-15 DIAGNOSIS — E1169 Type 2 diabetes mellitus with other specified complication: Secondary | ICD-10-CM

## 2023-02-15 DIAGNOSIS — Z7984 Long term (current) use of oral hypoglycemic drugs: Secondary | ICD-10-CM

## 2023-02-15 DIAGNOSIS — K219 Gastro-esophageal reflux disease without esophagitis: Secondary | ICD-10-CM | POA: Diagnosis not present

## 2023-02-19 ENCOUNTER — Telehealth: Payer: Self-pay

## 2023-02-19 NOTE — Telephone Encounter (Signed)
-----   Message from [name], MD sent at 02/19/2023 12:39 PM EDT ----- 1. Skin , right lower pretibia HYPERTROPHIC ACTINIC KERATOSIS 2. Skin , left chest HEMANGIOMA  1. Thick precancer, recommend recheck in 2-3 mos and cryotherapy if any recurrence 2. Benign "blood" mole  - please call patient

## 2023-02-19 NOTE — Telephone Encounter (Signed)
Advised pt of bx results and scheduled pt for f/u./sh

## 2023-06-04 NOTE — Progress Notes (Unsigned)
Name: Barbara Rivers   MRN: [id number]    DOB: 10-27-1956   Date:06/05/2023       Progress Note  Subjective  Chief Complaint  Follow Up  HPI  Diabetes type QQ:[id number] 06/12/2019 with A1C 6.7 %, last visit A1C was 7.2 % it was down to 7 %  it went up to 7.7 % and is down to 7.4 %, she is taking  Metformin 500 mg ER daily now, we will adjust dose to 750 mg . She has associated dyslipidemia, but she refuses statin therapy but willing to take Zetia. She denies polyphagia, polydipsia or polyuria    Dyslipidemia: she refuses statin therapy. Explained risk of heart attacks and strokes, discussed risk below . Reminded her that Zetia is not a statin. She wants to try medication    The 10-year ASCVD risk score (Arnett DK, et al., 2019) is: 11.1%   Values used to calculate the score:     Age: 67 years     Sex: Female     Is Non-Hispanic African American: No     Diabetic: Yes     Tobacco smoker: No     Systolic Blood Pressure: 130 mmHg     Is BP treated: No     HDL Cholesterol: 75 mg/dL     Total Cholesterol: 215 mg/dL    GERD: doing well since she changed to a GERD appropriate diet and is off Pepcid  Patient Active Problem List   Diagnosis Date Noted   Dyslipidemia due to type 2 diabetes mellitus (HCC) 08/28/2022   VV (varicose veins) 07/26/2016   Venous insufficiency of right leg 07/05/2016   Obesity (BMI 30.0-34.9) 07/05/2016   Elevated blood pressure 07/05/2016   Right knee pain 05/31/2016   Post-traumatic osteoarthritis of right knee 05/31/2016   Quadriceps tendon rupture 05/31/2016   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;  Service: Orthopedics;  Laterality: Right;  Lateral Retinacular Release, Chondroplasty  Medial Femoral Condyle and Patella, Partial Lateral Meniscectomy   QUADRICEPS TENDON REPAIR  11/15/2012   Procedure: REPAIR QUADRICEP TENDON;  Surgeon: Harvie Junior, MD;  Location: [facility];  Service: Orthopedics;  Laterality: Right;  WITH ALLOGRAFT   TONSILLECTOMY      Family History  Problem Relation Age of Onset   Diabetes Mother    Hypertension Mother    Congestive Heart Failure Mother    Diabetes Father    Kidney disease Father    Hypertension Father    Cancer Maternal Grandmother 71       Breast   Breast cancer Maternal Grandmother 58   Diabetes Paternal Grandmother     Social History   Tobacco Use   Smoking status: Never   Smokeless tobacco: Never  Substance Use Topics   Alcohol use: Yes    Alcohol/week: 1.0 standard drink of alcohol    Types: 1 Glasses of wine per week    Comment: rarely     Current Outpatient Medications:    baclofen (LIORESAL) 10 MG tablet, Take 1 tablet (10 mg total) by mouth 3 (three) times daily as needed for muscle spasms., Disp: 90 tablet, Rfl: 0   diclofenac Sodium (VOLTAREN) 1 % GEL, Apply 4  g topically 4 (four) times daily., Disp: 300 g, Rfl: 1   Glucos-Chondroit-Collag-Hyal (JOINT SUPPORT FORMULA PO), Take by mouth., Disp: , Rfl:    metFORMIN (GLUCOPHAGE-XR) 500 MG 24 hr tablet, Take 500 mg by mouth daily with breakfast., Disp: , Rfl:    Multiple Vitamin (MULTIVITAMIN) tablet, Take 1 tablet by mouth daily., Disp: , Rfl:    famotidine (PEPCID) 20 MG tablet, TAKE 1 TABLET BY MOUTH TWICE A DAY AS NEEDED FOR HEARTBURN OR INDIGESTION (Patient not taking: Reported on 06/05/2023), Disp: 90 tablet, Rfl: 0   traMADol (ULTRAM) 50 MG tablet, Take 1 tablet (50 mg total) by mouth 3 times/day as needed-between meals & bedtime. (Patient not taking: Reported on 06/05/2023), Disp: 120 tablet, Rfl: 0  Allergies  Allergen Reactions   Penicillins Hives    I personally reviewed active problem list, medication list, allergies, family  history, social history, health maintenance with the patient/caregiver today.   ROS  Ten systems reviewed and is negative except as mentioned in HPI  She has problems walking due to knee problems and uses a cane   Objective  Vitals:   06/05/23 0939  BP: 130/70  Pulse: 85  Resp: 16  SpO2: 95%  Weight: 194 lb (88 kg)  Height: 5\' 4"  (1.626 m)    Body mass index is 33.3 kg/m.  Physical Exam  Constitutional: Patient appears well-developed and well-nourished. Obese  No distress.  HEENT: head atraumatic, normocephalic, pupils equal and reactive to light, neck supple Cardiovascular: Normal rate, regular rhythm and normal heart sounds.  No murmur heard. No BLE edema. Pulmonary/Chest: Effort normal and breath sounds normal. No respiratory distress. Abdominal: Soft.  There is no tenderness. Psychiatric: Patient has a normal mood and affect. behavior is normal. Judgment and thought content normal.   PHQ2/9:    06/05/2023    9:40 AM 02/15/2023    7:58 AM 01/30/2023    8:58 AM 12/28/2022    1:29 PM 12/19/2022    8:34 AM  Depression screen PHQ 2/9  Decreased Interest 0 1 1 0 0  Down, Depressed, Hopeless 0 0 0 0 0  PHQ - 2 Score 0 1 1 0 0  Altered sleeping 0 0 0 0 0  Tired, decreased energy 0 1 1 0 0  Change in appetite 0 0 0 0 0  Feeling bad or failure about yourself  0 0 0 0 0  Trouble concentrating 0 0 0 0 0  Moving slowly or fidgety/restless 0 0 0 0 0  Suicidal thoughts 0 0 0 0 0  PHQ-9 Score 0 2 2 0 0  Difficult doing work/chores  Not difficult at all Not difficult at all      phq 9 is negative   Fall Risk:    06/05/2023    9:40 AM 02/15/2023    7:58 AM 01/30/2023    8:58 AM 12/28/2022    1:25 PM 12/19/2022    8:34 AM  Fall Risk   Falls in the past year? 0 0 0 0 0  Number falls in past yr: 0   0   Injury with Fall? 0   0   Risk for fall due to : Impaired balance/gait No Fall Risks Impaired balance/gait No Fall Risks No Fall Risks  Follow up Falls prevention discussed  Falls prevention discussed Falls prevention discussed;Education provided;Falls evaluation completed Education provided;Falls prevention discussed Falls prevention discussed      Functional Status Survey: Is the patient deaf or have difficulty hearing?: No  Does the patient have difficulty seeing, even when wearing glasses/contacts?: No Does the patient have difficulty concentrating, remembering, or making decisions?: No Does the patient have difficulty walking or climbing stairs?: Yes Does the patient have difficulty dressing or bathing?: No Does the patient have difficulty doing errands alone such as visiting a doctor's office or shopping?: No    Assessment & Plan  1. Dyslipidemia due to type 2 diabetes mellitus (HCC)  - POCT HgB A1C - ezetimibe (ZETIA) 10 MG tablet; Take 1 tablet (10 mg total) by mouth daily.  Dispense: 90 tablet; Refill: 3  2. Gastroesophageal reflux disease without esophagitis  Doing well with life style modification   3. Dyslipidemia  - ezetimibe (ZETIA) 10 MG tablet; Take 1 tablet (10 mg total) by mouth daily.  Dispense: 90 tablet; Refill: 3

## 2023-06-05 ENCOUNTER — Ambulatory Visit (INDEPENDENT_AMBULATORY_CARE_PROVIDER_SITE_OTHER): Payer: Medicare HMO | Admitting: Family Medicine

## 2023-06-05 ENCOUNTER — Encounter: Payer: Self-pay | Admitting: Family Medicine

## 2023-06-05 VITALS — BP 130/70 | HR 85 | Resp 16 | Ht 64.0 in | Wt 194.0 lb

## 2023-06-05 DIAGNOSIS — Z7984 Long term (current) use of oral hypoglycemic drugs: Secondary | ICD-10-CM | POA: Diagnosis not present

## 2023-06-05 DIAGNOSIS — E1169 Type 2 diabetes mellitus with other specified complication: Secondary | ICD-10-CM | POA: Diagnosis not present

## 2023-06-05 DIAGNOSIS — E785 Hyperlipidemia, unspecified: Secondary | ICD-10-CM | POA: Diagnosis not present

## 2023-06-05 DIAGNOSIS — K219 Gastro-esophageal reflux disease without esophagitis: Secondary | ICD-10-CM

## 2023-06-05 LAB — POCT GLYCOSYLATED HEMOGLOBIN (HGB A1C): Hemoglobin A1C: 7.4 % — AB (ref 4.0–5.6)

## 2023-06-05 MED ORDER — METFORMIN HCL ER 750 MG PO TB24: 750.0000 mg | ORAL_TABLET | Freq: Every day | ORAL | 1 refills | Status: DC

## 2023-06-05 MED ORDER — EZETIMIBE 10 MG PO TABS: 10.0000 mg | ORAL_TABLET | Freq: Every day | ORAL | 3 refills | Status: DC

## 2023-06-12 ENCOUNTER — Other Ambulatory Visit: Payer: Self-pay | Admitting: Family Medicine

## 2023-06-12 ENCOUNTER — Ambulatory Visit: Payer: Medicare HMO | Admitting: Dermatology

## 2023-06-12 ENCOUNTER — Encounter: Payer: Self-pay | Admitting: Family Medicine

## 2023-06-12 VITALS — BP 137/78 | HR 67

## 2023-06-12 DIAGNOSIS — L570 Actinic keratosis: Secondary | ICD-10-CM

## 2023-06-12 DIAGNOSIS — L578 Other skin changes due to chronic exposure to nonionizing radiation: Secondary | ICD-10-CM | POA: Diagnosis not present

## 2023-06-12 DIAGNOSIS — I781 Nevus, non-neoplastic: Secondary | ICD-10-CM | POA: Diagnosis not present

## 2023-06-12 DIAGNOSIS — W908XXA Exposure to other nonionizing radiation, initial encounter: Secondary | ICD-10-CM

## 2023-06-12 DIAGNOSIS — E1169 Type 2 diabetes mellitus with other specified complication: Secondary | ICD-10-CM

## 2023-06-12 DIAGNOSIS — L729 Follicular cyst of the skin and subcutaneous tissue, unspecified: Secondary | ICD-10-CM

## 2023-06-12 DIAGNOSIS — I8393 Asymptomatic varicose veins of bilateral lower extremities: Secondary | ICD-10-CM

## 2023-06-12 DIAGNOSIS — L720 Epidermal cyst: Secondary | ICD-10-CM

## 2023-06-12 NOTE — Patient Instructions (Addendum)

## 2023-06-12 NOTE — Progress Notes (Signed)
Follow-Up Visit   Subjective  Barbara Rivers is a 67 y.o. female who presents for the following:  Biopsy proven hypertrophic AK of the right lower pretibia (02/14/2023). She also has a growth on her left lower back that gets irritated by aqua belt in the pool.   The patient has spots, moles and lesions to be evaluated, some may be new or changing and the patient may have concern these could be cancer.   The following portions of the chart were reviewed this encounter and updated as appropriate: medications, allergies, medical history  Review of Systems:  No other skin or systemic complaints except as noted in HPI or Assessment and Plan.  Objective  Well appearing patient in no apparent distress; mood and affect are within normal limits.  A focused examination was performed of the following areas: Face, legs  Relevant physical exam findings are noted in the Assessment and Plan.  Right Lower Pretibia Biopsy scar - clear.    Assessment & Plan   Hypertrophic actinic keratosis Right Lower Pretibia  Biopsy proven, 02/14/2023.  Clear. Observe for recurrence. Call clinic for new or changing lesions.  Recommend regular skin exams, daily broad-spectrum spf 30+ sunscreen use, and photoprotection.     Actinic keratoses are precancerous spots that appear secondary to cumulative UV radiation exposure/sun exposure over time. They are chronic with expected duration over 1 year. A portion of actinic keratoses will progress to squamous cell carcinoma of the skin. It is not possible to reliably predict which spots will progress to skin cancer and so treatment is recommended to prevent development of skin cancer.  Recommend daily broad spectrum sunscreen SPF 30+ to sun-exposed areas, reapply every 2 hours as needed.  Recommend staying in the shade or wearing long sleeves, sun glasses (UVA+UVB protection) and wide brim hats (4-inch brim around the entire circumference of the hat). Call for new  or changing lesions.  ACTINIC DAMAGE - chronic, secondary to cumulative UV radiation exposure/sun exposure over time - diffuse scaly erythematous macules with underlying dyspigmentation - Recommend daily broad spectrum sunscreen SPF 30+ to sun-exposed areas, reapply every 2 hours as needed.  - Recommend staying in the shade or wearing long sleeves, sun glasses (UVA+UVB protection) and wide brim hats (4-inch brim around the entire circumference of the hat). - Call for new or changing lesions.   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 Larger vessels may be covered if symptomatic and we would refer to vascular surgeon if treatment desired.  EPIDERMAL INCLUSION CYST Exam: Firm subcutaneous nodule at left lower back, 3.0 CM  Benign-appearing. Exam most consistent with an epidermal inclusion cyst. Discussed that a cyst is a benign growth that can grow over time and sometimes get irritated or inflamed. Recommend observation if it is not bothersome. Discussed option of surgical excision to remove it if it is growing, symptomatic, or other changes noted. Please call for new or changing lesions so they can be evaluated.   Return in about 6 months (around 12/13/2023) for TBSE, Hx BCC.  ICherlyn Labella, CMA, am acting as scribe for [name], MD .   Documentation: I have reviewed the above documentation for accuracy and completeness, and I agree with the above.  [name], MD

## 2023-06-13 ENCOUNTER — Other Ambulatory Visit: Payer: Self-pay | Admitting: Family Medicine

## 2023-06-13 MED ORDER — METFORMIN HCL ER 500 MG PO TB24: 1000.0000 mg | ORAL_TABLET | Freq: Every day | ORAL | 1 refills | Status: DC

## 2023-06-14 ENCOUNTER — Other Ambulatory Visit: Payer: Self-pay

## 2023-07-24 ENCOUNTER — Ambulatory Visit
Admission: RE | Admit: 2023-07-24 | Discharge: 2023-07-24 | Disposition: A | Discharge status: Home / Self Care | Payer: Medicare HMO | Source: Ambulatory Visit | Attending: Family Medicine | Admitting: Family Medicine

## 2023-07-24 DIAGNOSIS — Z780 Asymptomatic menopausal state: Secondary | ICD-10-CM | POA: Diagnosis not present

## 2023-07-24 DIAGNOSIS — Z1231 Encounter for screening mammogram for malignant neoplasm of breast: Secondary | ICD-10-CM | POA: Diagnosis not present

## 2023-07-24 DIAGNOSIS — Z0000 Encounter for general adult medical examination without abnormal findings: Secondary | ICD-10-CM | POA: Insufficient documentation

## 2023-07-24 DIAGNOSIS — Z13820 Encounter for screening for osteoporosis: Secondary | ICD-10-CM | POA: Diagnosis not present

## 2023-08-28 NOTE — Progress Notes (Unsigned)
Name: Barbara Rivers   MRN: [id number]    DOB: November 06, 1956   Date:08/29/2023       Progress Note  Subjective  Chief Complaint  Follow up  HPI  Work related injury/chronic right knee pain/CRPS: she is still having daily pain on right knee, decrease rom , she  uses a cane for ambulation and when groceries shopping has to use the store cart to help her work.  Cannot tolerate Lyrica or gabapentin She is still taking baclofen prn, she  also takes Ibuprofen, Tramadol and topical voltaren and magnesium cream. She also has massage therapy about every 3 weeks.  She has atrophy of thigh and right leg weakness. She also has post-traumatic OA she had synvisc injection. Unable to work secondary to pain and has social security disability. Injury happened 07/2011, settlement 2018. Surgery was in 2013 and quadriceps tendon repair 2014 after a rupture.  She  continues to use  [employer] at home. She states water activity helps a lot with her symptoms but her pool water is too cold at this time of the year. Pain is 6/10 on average but today is up to 8/10. She states cold weather also increases the pain . She states heat seems to help and she tries to apply heat before bed time . She has been using a walker to be able to walk longer - she is doing laps around her pool.   Patient Active Problem List   Diagnosis Date Noted   Dyslipidemia due to type 2 diabetes mellitus (HCC) 08/28/2022   VV (varicose veins) 07/26/2016   Venous insufficiency of right leg 07/05/2016   Obesity (BMI 30.0-34.9) 07/05/2016   Elevated blood pressure 07/05/2016   Chronic pain of right knee 05/31/2016   Post-traumatic osteoarthritis of right knee 05/31/2016   Quadriceps tendon rupture 05/31/2016   CRPS type II 05/31/2016   Muscle wasting and atrophy, not elsewhere classified, right thigh 05/31/2016    Past Surgical History:  Procedure Laterality Date   CERVICAL CONIZATION W/BX     KNEE ARTHROSCOPY  02/23/2012   Procedure: ARTHROSCOPY  KNEE;  Surgeon: Harvie Junior, MD;  Location: [facility];  Service: Orthopedics;  Laterality: Right;  Lateral Retinacular Release, Chondroplasty Medial Femoral Condyle and Patella, Partial Lateral Meniscectomy   QUADRICEPS TENDON REPAIR  11/15/2012   Procedure: REPAIR QUADRICEP TENDON;  Surgeon: Harvie Junior, MD;  Location: [facility];  Service: Orthopedics;  Laterality: Right;  WITH ALLOGRAFT   TONSILLECTOMY      Family History  Problem Relation Age of Onset   Diabetes Mother    Hypertension Mother    Congestive Heart Failure Mother    Diabetes Father    Kidney disease Father    Hypertension Father    Cancer Maternal Grandmother 88       Breast   Breast cancer Maternal Grandmother 49   Diabetes Paternal Grandmother     Social History   Tobacco Use   Smoking status: Never   Smokeless tobacco: Never  Substance Use Topics   Alcohol use: Yes    Alcohol/week: 1.0 standard drink of alcohol    Types: 1 Glasses of wine per week    Comment: rarely     Current Outpatient Medications:    diclofenac Sodium (VOLTAREN) 1 % GEL, Apply 4 g topically 4 (four) times daily., Disp: 300 g, Rfl: 1   ezetimibe (ZETIA) 10 MG tablet, Take 1 tablet (10 mg total) by mouth daily., Disp: 90 tablet,  Rfl: 3   Glucos-Chondroit-Collag-Hyal (JOINT SUPPORT FORMULA PO), Take by mouth., Disp: , Rfl:    metFORMIN (GLUCOPHAGE-XR) 500 MG 24 hr tablet, Take 2 tablets (1,000 mg total) by mouth daily with breakfast., Disp: 180 tablet, Rfl: 1   Multiple Vitamin (MULTIVITAMIN) tablet, Take 1 tablet by mouth daily., Disp: , Rfl:    baclofen (LIORESAL) 10 MG tablet, Take 1 tablet (10 mg total) by mouth 3 (three) times daily as needed for muscle spasms., Disp: 90 tablet, Rfl: 0   traMADol (ULTRAM) 50 MG tablet, Take 1 tablet (50 mg total) by mouth 3 times/day as needed-between meals & bedtime. (Patient not taking: Reported on 08/29/2023), Disp: 120 tablet, Rfl: 0  Allergies  Allergen  Reactions   Penicillins Hives    I personally reviewed active problem list, medication list, allergies, family history with the patient/caregiver today.   ROS  Ten systems reviewed and is negative except as mentioned in HPI    Objective  Vitals:   08/29/23 1035  BP: 138/78  Pulse: 97  Resp: 14  Temp: 98.6 F (37 C)  TempSrc: Oral  SpO2: 100%  Weight: 197 lb 11.2 oz (89.7 kg)  Height: 5\' 4"  (1.626 m)    Body mass index is 33.94 kg/m.  Physical Exam  Constitutional: Patient appears well-developed and well-nourished. Obese  No distress.  HEENT: head atraumatic, normocephalic, pupils equal and reactive to light, neck supple Cardiovascular: Normal rate, regular rhythm and normal heart sounds.  No murmur heard. No BLE edema. Pulmonary/Chest: Effort normal and breath sounds normal. No respiratory distress. Abdominal: Soft.  There is no tenderness. Muscular skeletal: scar on right knee, some swelling, decrease rom of right knee, no erythema or increase in warmth  Psychiatric: Patient has a normal mood and affect. behavior is normal. Judgment and thought content normal.   Recent Results (from the past 2160 hour(s))  POCT HgB A1C     Status: Abnormal   Collection Time: 06/05/23  9:48 AM  Result Value Ref Range   Hemoglobin A1C 7.4 (A) 4.0 - 5.6 %   HbA1c POC (<> result, manual entry)     HbA1c, POC (prediabetic range)     HbA1c, POC (controlled diabetic range)       PHQ2/9:    08/29/2023   10:37 AM 06/05/2023    9:40 AM 02/15/2023    7:58 AM 01/30/2023    8:58 AM 12/28/2022    1:29 PM  Depression screen PHQ 2/9  Decreased Interest 0 0 1 1 0  Down, Depressed, Hopeless 0 0 0 0 0  PHQ - 2 Score 0 0 1 1 0  Altered sleeping 0 0 0 0 0  Tired, decreased energy 0 0 1 1 0  Change in appetite 0 0 0 0 0  Feeling bad or failure about yourself  0 0 0 0 0  Trouble concentrating 0 0 0 0 0  Moving slowly or fidgety/restless 0 0 0 0 0  Suicidal thoughts 0 0 0 0 0  PHQ-9 Score 0  0 2 2 0  Difficult doing work/chores   Not difficult at all Not difficult at all     phq 9 is negative   Fall Risk:    08/29/2023   10:37 AM 06/05/2023    9:40 AM 02/15/2023    7:58 AM 01/30/2023    8:58 AM 12/28/2022    1:25 PM  Fall Risk   Falls in the past year? 0 0 0 0 0  Number falls  in past yr:  0   0  Injury with Fall?  0   0  Risk for fall due to : Impaired balance/gait;Orthopedic patient Impaired balance/gait No Fall Risks Impaired balance/gait No Fall Risks  Follow up Falls prevention discussed;Education provided;Falls evaluation completed Falls prevention discussed Falls prevention discussed Falls prevention discussed;Education provided;Falls evaluation completed Education provided;Falls prevention discussed     Functional Status Survey: Is the patient deaf or have difficulty hearing?: No Does the patient have difficulty seeing, even when wearing glasses/contacts?: No Does the patient have difficulty concentrating, remembering, or making decisions?: No Does the patient have difficulty walking or climbing stairs?: Yes Does the patient have difficulty dressing or bathing?: No Does the patient have difficulty doing errands alone such as visiting a doctor's office or shopping?: No    Assessment & Plan  1. Chronic pain of right knee  - baclofen (LIORESAL) 10 MG tablet; Take 1 tablet (10 mg total) by mouth 3 (three) times daily as needed for muscle spasms.  Dispense: 90 tablet; Refill: 0  2. Spasm  - baclofen (LIORESAL) 10 MG tablet; Take 1 tablet (10 mg total) by mouth 3 (three) times daily as needed for muscle spasms.  Dispense: 90 tablet; Refill: 0  3. Muscle wasting and atrophy, not elsewhere classified, right thigh  - baclofen (LIORESAL) 10 MG tablet; Take 1 tablet (10 mg total) by mouth 3 (three) times daily as needed for muscle spasms.  Dispense: 90 tablet; Refill: 0

## 2023-08-29 ENCOUNTER — Encounter: Payer: Self-pay | Admitting: Family Medicine

## 2023-08-29 ENCOUNTER — Ambulatory Visit (INDEPENDENT_AMBULATORY_CARE_PROVIDER_SITE_OTHER): Payer: Self-pay | Admitting: Family Medicine

## 2023-08-29 VITALS — BP 138/78 | HR 97 | Temp 98.6°F | Resp 14 | Ht 64.0 in | Wt 197.7 lb

## 2023-08-29 DIAGNOSIS — M25561 Pain in right knee: Secondary | ICD-10-CM

## 2023-08-29 DIAGNOSIS — M62551 Muscle wasting and atrophy, not elsewhere classified, right thigh: Secondary | ICD-10-CM

## 2023-08-29 DIAGNOSIS — R252 Cramp and spasm: Secondary | ICD-10-CM

## 2023-08-29 DIAGNOSIS — Z23 Encounter for immunization: Secondary | ICD-10-CM

## 2023-08-29 DIAGNOSIS — G8929 Other chronic pain: Secondary | ICD-10-CM

## 2023-08-29 MED ORDER — BACLOFEN 10 MG PO TABS: 10.0000 mg | ORAL_TABLET | Freq: Three times a day (TID) | ORAL | 0 refills | Status: DC | PRN

## 2023-11-05 ENCOUNTER — Ambulatory Visit: Payer: Medicare HMO | Admitting: Family Medicine

## 2023-12-04 ENCOUNTER — Ambulatory Visit (INDEPENDENT_AMBULATORY_CARE_PROVIDER_SITE_OTHER): Payer: Medicare HMO | Admitting: Family Medicine

## 2023-12-04 ENCOUNTER — Encounter: Payer: Self-pay | Admitting: Family Medicine

## 2023-12-04 VITALS — BP 136/78 | HR 80 | Temp 98.1°F | Resp 16 | Ht 64.0 in | Wt 190.2 lb

## 2023-12-04 DIAGNOSIS — M545 Low back pain, unspecified: Secondary | ICD-10-CM

## 2023-12-04 DIAGNOSIS — Z79899 Other long term (current) drug therapy: Secondary | ICD-10-CM | POA: Diagnosis not present

## 2023-12-04 DIAGNOSIS — R0981 Nasal congestion: Secondary | ICD-10-CM

## 2023-12-04 DIAGNOSIS — E785 Hyperlipidemia, unspecified: Secondary | ICD-10-CM | POA: Diagnosis not present

## 2023-12-04 DIAGNOSIS — K219 Gastro-esophageal reflux disease without esophagitis: Secondary | ICD-10-CM

## 2023-12-04 DIAGNOSIS — E1169 Type 2 diabetes mellitus with other specified complication: Secondary | ICD-10-CM

## 2023-12-04 LAB — POCT GLYCOSYLATED HEMOGLOBIN (HGB A1C): Hemoglobin A1C: 7.8 % — AB (ref 4.0–5.6)

## 2023-12-04 MED ORDER — BACLOFEN 10 MG PO TABS: 10.0000 mg | ORAL_TABLET | Freq: Three times a day (TID) | ORAL | 0 refills | Status: DC | PRN

## 2023-12-04 NOTE — Progress Notes (Addendum)
Name: Barbara Rivers   MRN: [id number]    DOB: 1956-01-04   Date:12/04/2023       Progress Note  Subjective  Chief Complaint  Chief Complaint  Patient presents with   Medical Management of Chronic Issues    DM- pt stopped taking 500mg  BID around Thanksgiving due to stress and felt it was too much for her    HPI   Diabetes type [id number] 06/12/2019 with A1C 6.7 %, she was advised to take two Metformins 500 mg XR and she was doing well but about 2 months ago she noticed some dizziness after taking medication in the mornings so she went down to one pill daily. She has been under more stress - due to her mother being sick. She also noticed taking at night seems to be better, she is willing to try going up to two at night. Discussed adding pioglitazone , SGL2 agonist or GLP-1 agonist. Denies family history of thyroid cancer, no personal history of pancreatitis. She states she is willing to try GLP-1 agonist if unable to go up to two tablets of metformin daily. She denies polyphagia, polydipsia or polyuria    Dyslipidemia: she refuses statin therapy. Explained risk of heart attacks and strokes, discussed risk below .We tried Zetia she had diarrhea and stopped but wiling to try it again   GERD: doing well since she changed to a GERD appropriate diet and is off Pepcid. We will continue monitoring    Intermittent low back pain: she states triggered by physical activity, she has been taking baclofen prn and needs a refill today  Nasal congestion: going on for over one week, taking otc nasal spray - not sure of the name, discussed saline spray, flonase or otc ant histamines like claritin or zyrtec  Patient Active Problem List   Diagnosis Date Noted   Dyslipidemia due to type 2 diabetes mellitus (HCC) 08/28/2022   VV (varicose veins) 07/26/2016   Venous insufficiency of right leg 07/05/2016   Obesity (BMI 30.0-34.9) 07/05/2016   Chronic pain of right knee 05/31/2016   Post-traumatic  osteoarthritis of right knee 05/31/2016   Quadriceps tendon rupture 05/31/2016   CRPS type II 05/31/2016   Muscle wasting and atrophy, not elsewhere classified, right thigh 05/31/2016    Past Surgical History:  Procedure Laterality Date   CERVICAL CONIZATION W/BX     KNEE ARTHROSCOPY  02/23/2012   Procedure: ARTHROSCOPY KNEE;  Surgeon: Harvie Junior, MD;  Location: [facility];  Service: Orthopedics;  Laterality: Right;  Lateral Retinacular Release, Chondroplasty Medial Femoral Condyle and Patella, Partial Lateral Meniscectomy   QUADRICEPS TENDON REPAIR  11/15/2012   Procedure: REPAIR QUADRICEP TENDON;  Surgeon: Harvie Junior, MD;  Location: [facility];  Service: Orthopedics;  Laterality: Right;  WITH ALLOGRAFT   TONSILLECTOMY      Family History  Problem Relation Age of Onset   Diabetes Mother    Hypertension Mother    Congestive Heart Failure Mother    Diabetes Father    Kidney disease Father    Hypertension Father    Cancer Maternal Grandmother 32       Breast   Breast cancer Maternal Grandmother 38   Diabetes Paternal Grandmother     Social History   Tobacco Use   Smoking status: Never   Smokeless tobacco: Never  Substance Use Topics   Alcohol use: Yes    Alcohol/week: 1.0 standard drink of alcohol    Types: 1 Glasses of wine  per week    Comment: rarely     Current Outpatient Medications:    diclofenac Sodium (VOLTAREN) 1 % GEL, Apply 4 g topically 4 (four) times daily., Disp: 300 g, Rfl: 1   metFORMIN (GLUCOPHAGE-XR) 500 MG 24 hr tablet, Take 2 tablets (1,000 mg total) by mouth daily with breakfast. (Patient taking differently: Take 500 mg by mouth daily with breakfast.), Disp: 180 tablet, Rfl: 1   Multiple Vitamin (MULTIVITAMIN) tablet, Take 1 tablet by mouth daily., Disp: , Rfl:    baclofen (LIORESAL) 10 MG tablet, Take 1 tablet (10 mg total) by mouth 3 (three) times daily as needed for muscle spasms., Disp: 90 tablet, Rfl: 0    ezetimibe (ZETIA) 10 MG tablet, Take 1 tablet (10 mg total) by mouth daily. (Patient not taking: Reported on 12/04/2023), Disp: 90 tablet, Rfl: 3   traMADol (ULTRAM) 50 MG tablet, Take 1 tablet (50 mg total) by mouth 3 times/day as needed-between meals & bedtime. (Patient not taking: Reported on 12/04/2023), Disp: 120 tablet, Rfl: 0  Allergies  Allergen Reactions   Penicillins Hives    I personally reviewed active problem list, medication list, allergies with the patient/caregiver today.   ROS  Ten systems reviewed and is negative except as mentioned in HPI    Objective  Vitals:   12/04/23 1425  BP: 136/78  Pulse: 80  Resp: 16  Temp: 98.1 F (36.7 C)  TempSrc: Oral  SpO2: 98%  Weight: 190 lb 3.2 oz (86.3 kg)  Height: 5\' 4"  (1.626 m)    Body mass index is 32.65 kg/m.  Physical Exam  Constitutional: Patient appears well-developed and well-nourished. Obese  No distress.  HEENT: head atraumatic, normocephalic, pupils equal and reactive to light,, neck supple Cardiovascular: Normal rate, regular rhythm and normal heart sounds.  No murmur heard. No BLE edema. Pulmonary/Chest: Effort normal and breath sounds normal. No respiratory distress. Muscular skeletal: uses a cane, right quad weakness Abdominal: Soft.  There is no tenderness. Psychiatric: Patient has a normal mood and affect. behavior is normal. Judgment and thought content normal.   Recent Results (from the past 2160 hours)  POCT glycosylated hemoglobin (Hb A1C)     Status: Abnormal   Collection Time: 12/04/23  2:29 PM  Result Value Ref Range   Hemoglobin A1C 7.8 (A) 4.0 - 5.6 %   HbA1c POC (<> result, manual entry)     HbA1c, POC (prediabetic range)     HbA1c, POC (controlled diabetic range)      Diabetic Foot Exam:  Title   Diabetic Foot Exam - detailed Visual Foot Exam completed.: Yes   Pulse Foot Exam completed.: Yes      Sensory Foot Exam Completed.: Yes Semmes-Weinstein Monofilament Test "+" means  "has sensation" and "-" means "no sensation"      Image components are not supported.   Image components are not supported. Image components are not supported.  [place] Comments      PHQ2/9:    12/04/2023    2:12 PM 08/29/2023   10:37 AM 06/05/2023    9:40 AM 02/15/2023    7:58 AM 01/30/2023    8:58 AM  Depression screen PHQ 2/9  Decreased Interest 0 0 0 1 1  Down, Depressed, Hopeless 0 0 0 0 0  PHQ - 2 Score 0 0 0 1 1  Altered sleeping 0 0 0 0 0  Tired, decreased energy 0 0 0 1 1  Change in appetite 0 0 0 0 0  Feeling bad or failure about yourself  0 0 0 0 0  Trouble concentrating 0 0 0 0 0  Moving slowly or fidgety/restless 0 0 0 0 0  Suicidal thoughts 0 0 0 0 0  PHQ-9 Score 0 0 0 2 2  Difficult doing work/chores Not difficult at all   Not difficult at all Not difficult at all    phq 9 is negative  Fall Risk:    12/04/2023    2:12 PM 08/29/2023   10:37 AM 06/05/2023    9:40 AM 02/15/2023    7:58 AM 01/30/2023    8:58 AM  Fall Risk   Falls in the past year? 0 0 0 0 0  Number falls in past yr: 0  0    Injury with Fall? 0  0    Risk for fall due to : No Fall Risks Impaired balance/gait;Orthopedic patient Impaired balance/gait No Fall Risks Impaired balance/gait  Follow up Falls prevention discussed;Education provided;Falls evaluation completed Falls prevention discussed;Education provided;Falls evaluation completed Falls prevention discussed Falls prevention discussed Falls prevention discussed;Education provided;Falls evaluation completed     Assessment & Plan  1. Dyslipidemia due to type 2 diabetes mellitus (HCC) (Primary)  - POCT glycosylated hemoglobin (Hb A1C) - HM Diabetes Foot Exam - Lipid panel - Microalbumin / creatinine urine ratio  Resume metformin two daily   2. Gastroesophageal reflux disease without esophagitis  Doing well   3. Long-term use of high-risk medication  - CBC with Differential/Platelet - COMPLETE METABOLIC PANEL WITH  GFR  4. Intermittent low back pain  - baclofen (LIORESAL) 10 MG tablet; Take 1 tablet (10 mg total) by mouth 3 (three) times daily as needed for muscle spasms.  Dispense: 90 tablet; Refill: 0

## 2023-12-04 NOTE — Patient Instructions (Signed)
Claritin Flonase Nasal saline

## 2023-12-06 ENCOUNTER — Other Ambulatory Visit: Payer: Self-pay | Admitting: Family Medicine

## 2023-12-24 ENCOUNTER — Ambulatory Visit (INDEPENDENT_AMBULATORY_CARE_PROVIDER_SITE_OTHER): Payer: Medicare HMO | Admitting: Dermatology

## 2023-12-24 DIAGNOSIS — W908XXA Exposure to other nonionizing radiation, initial encounter: Secondary | ICD-10-CM

## 2023-12-24 DIAGNOSIS — L918 Other hypertrophic disorders of the skin: Secondary | ICD-10-CM

## 2023-12-24 DIAGNOSIS — Z1283 Encounter for screening for malignant neoplasm of skin: Secondary | ICD-10-CM | POA: Diagnosis not present

## 2023-12-24 DIAGNOSIS — D229 Melanocytic nevi, unspecified: Secondary | ICD-10-CM

## 2023-12-24 DIAGNOSIS — Z85828 Personal history of other malignant neoplasm of skin: Secondary | ICD-10-CM | POA: Diagnosis not present

## 2023-12-24 DIAGNOSIS — L814 Other melanin hyperpigmentation: Secondary | ICD-10-CM

## 2023-12-24 DIAGNOSIS — Z872 Personal history of diseases of the skin and subcutaneous tissue: Secondary | ICD-10-CM

## 2023-12-24 DIAGNOSIS — L578 Other skin changes due to chronic exposure to nonionizing radiation: Secondary | ICD-10-CM | POA: Diagnosis not present

## 2023-12-24 DIAGNOSIS — L821 Other seborrheic keratosis: Secondary | ICD-10-CM | POA: Diagnosis not present

## 2023-12-24 DIAGNOSIS — D1801 Hemangioma of skin and subcutaneous tissue: Secondary | ICD-10-CM

## 2023-12-24 DIAGNOSIS — I8393 Asymptomatic varicose veins of bilateral lower extremities: Secondary | ICD-10-CM | POA: Diagnosis not present

## 2023-12-24 DIAGNOSIS — I781 Nevus, non-neoplastic: Secondary | ICD-10-CM | POA: Diagnosis not present

## 2023-12-24 DIAGNOSIS — L820 Inflamed seborrheic keratosis: Secondary | ICD-10-CM

## 2023-12-24 DIAGNOSIS — L739 Follicular disorder, unspecified: Secondary | ICD-10-CM

## 2023-12-24 MED ORDER — FLUOCINONIDE 0.05 % EX SOLN: CUTANEOUS | 3 refills | Status: AC

## 2023-12-24 NOTE — Patient Instructions (Addendum)
 Mole A mole is a colored (pigmented) growth on the skin. Moles are very common. They are usually harmless, but some moles can become cancerous over time. What are the causes? Moles are caused when pigmented skin cells grow together in clusters instead of spreading out in the skin as they normally do. The reason why the skin cells grow together in clusters is not known. What increases the risk? You are more likely to develop a mole if: You have family members who have moles. You are fair skinned. You have red or blond hair. You are often outdoors and exposed to the sun. You received phototherapy when you were a newborn baby. You are female. What are the signs or symptoms? A mole may occur anywhere on your skin. A mole may be: [place] or another color. Although moles are most often brown, they can also be tan, black, red, pink, blue, skin-toned, or colorless. Flat or raised. Smooth or wrinkled. Round in shape. How is this diagnosed? A mole is diagnosed with a skin exam. If your health care provider thinks a mole may be cancerous, all or part of the mole will be removed for testing (biopsy). How is this treated? Most moles are noncancerous (benign) and do not require treatment. If a mole is found to be cancerous, it will be removed. You may also choose to have a mole removed if it is causing pain or if you do not like the way it looks. Follow these instructions at home: General instructions  Every month, look for new moles and check your existing moles for changes. This is important because a change in a mole can mean that the mole has become cancerous. ABCDE changes in a mole indicate that you should be evaluated by your health care provider. ABCDE stands for: Asymmetry. This means the mole has an irregular shape. It is not round or oval. Border. This means the mole has an irregular or bumpy border. Color. This means the mole has multiple colors in it, including brown, black, blue, red, or  tan. Note that it is normal for moles to get darker when a woman is pregnant or takes birth control pills. Diameter. This means the mole is more than 0.2 inches (6 mm) across. Evolving. This refers to any unusual changes or symptoms in the mole, such as pain, itching, stinging, sensitivity, or bleeding. If you have a large number of moles, see a skin doctor (dermatologist) at least one time every year for a full-body skin check. Lifestyle  When you are outdoors, wear sunscreen with SPF 30 (sun protection factor 30) or higher. Use an adequate amount of sunscreen to cover exposed areas of skin. Put it on 30 minutes before you go out. Reapply it every 2 hours or anytime you come out of the water. When you are out in the sun, wear a broad-brimmed hat and clothing that covers your arms and legs. Wear wraparound sunglasses. Contact a health care provider if: The size, shape, borders, or color of your mole changes. Your mole, or the skin near the mole, becomes painful, sore, red, or swollen. Your mole: Develops more than one color. Itches or bleeds. Becomes scaly, sheds skin, or oozes fluid. Becomes flat or develops raised areas. Becomes hard or soft. You develop a new mole. Summary A mole is a colored (pigmented) growth on the skin. Moles are very common. They are usually harmless, but some moles can become cancerous over time. Every month, look for new moles and check your  existing moles for changes. This is important because a change in a mole can mean that the mole has become cancerous. If you have a large number of moles, see a skin doctor (dermatologist) at least one time every year for a full-body skin check. When you are outdoors, wear sunscreen with SPF 30 (sun protection factor 30) or higher. Reapply it every 2 hours or anytime you come out of the water. Contact a health care provider if you notice changes in a mole or if you develop a new mole. This information is not intended to replace  advice given to you by your health care provider. Make sure you discuss any questions you have with your health care provider. Document Revised: 07/14/2021 Document Reviewed: 07/14/2021 Elsevier Patient Education  2024 [employer].     Due to recent changes in healthcare laws, you may see results of your pathology and/or laboratory studies on MyChart before the doctors have had a chance to review them. We understand that in some cases there may be results that are confusing or concerning to you. Please understand that not all results are received at the same time and often the doctors may need to interpret multiple results in order to provide you with the best plan of care or course of treatment. Therefore, we ask that you please give [place] 2 business days to thoroughly review all your results before contacting the office for clarification. Should we see a critical lab result, you will be contacted sooner.   If You Need Anything After Your Visit  If you have any questions or concerns for your doctor, please call our main line at [phone] and press option 4 to reach your doctor's medical assistant. If no one answers, please leave a voicemail as directed and we will return your call as soon as possible. Messages left after 4 pm will be answered the following business day.   You may also send [place] a message via MyChart. We typically respond to MyChart messages within 1-2 business days.  For prescription refills, please ask your pharmacy to contact our office. Our fax number is [phone].  If you have an urgent issue when the clinic is closed that cannot wait until the next business day, you can page your doctor at the number below.    Please note that while we do our best to be available for urgent issues outside of office hours, we are not available 24/7.   If you have an urgent issue and are unable to reach [place], you may choose to seek medical care at your doctor's office, retail clinic,  urgent care center, or emergency room.  If you have a medical emergency, please immediately call 911 or go to the emergency department.  Pager Numbers  - Dr. Gwen Pounds: [phone]  - Dr. Roseanne Reno: [phone]  - Dr. Katrinka Blazing: [phone]   In the event of inclement weather, please call our main line at [phone] for an update on the status of any delays or closures.  Dermatology Medication Tips: Please keep the boxes that topical medications come in in order to help keep track of the instructions about where and how to use these. Pharmacies typically print the medication instructions only on the boxes and not directly on the medication tubes.   If your medication is too expensive, please contact our office at [phone] option 4 or send [place] a message through MyChart.   We are unable to tell what your co-pay for medications will be in advance as  this is different depending on your insurance coverage. However, we may be able to find a substitute medication at lower cost or fill out paperwork to get insurance to cover a needed medication.   If a prior authorization is required to get your medication covered by your insurance company, please allow [place] 1-2 business days to complete this process.  Drug prices often vary depending on where the prescription is filled and some pharmacies may offer cheaper prices.  The website www.goodrx.com contains coupons for medications through different pharmacies. The prices here do not account for what the cost may be with help from insurance (it may be cheaper with your insurance), but the website can give you the price if you did not use any insurance.  - You can print the associated coupon and take it with your prescription to the pharmacy.  - You may also stop by our office during regular business hours and pick up a GoodRx coupon card.  - If you need your prescription sent electronically to a different pharmacy, notify our office through [facility] or by phone at [phone] option 4.     Si Usted Necesita Algo Despus de Su Visita  Tambin puede enviarnos un mensaje a travs de Clinical cytogeneticist. Por lo general respondemos a los mensajes de MyChart en el transcurso de 1 a 2 das hbiles.  Para renovar recetas, por favor pida a su farmacia que se ponga en contacto con nuestra oficina. Annie Sable de fax es [place] [phone].  Si tiene un asunto urgente cuando la clnica est cerrada y que no puede esperar hasta el siguiente da hbil, puede llamar/localizar a su doctor(a) al nmero que aparece a continuacin.   Por favor, tenga en cuenta que aunque hacemos todo lo posible para estar disponibles para asuntos urgentes fuera del horario de [place], no estamos disponibles las 24 horas del da, los 7 [address]  [address] de la [place].   Si tiene un problema urgente y no puede comunicarse con nosotros, puede optar por buscar atencin mdica  en el consultorio de su doctor(a), en una clnica privada, en un centro de atencin urgente o en una sala de emergencias.  Si tiene Engineer, drilling, por favor llame inmediatamente al 911 o vaya a la sala de emergencias.  Nmeros de bper  - Dr. Gwen Pounds: [phone]  - Dra. Roseanne Reno: [phone]  - Dr. Katrinka Blazing: [phone]   En caso de inclemencias del tiempo, por favor llame a Lacy Duverney principal al [phone] para una actualizacin sobre el [place] de cualquier retraso o cierre.  Consejos para la medicacin en dermatologa: Por favor, guarde las cajas en las que vienen los medicamentos de uso tpico para ayudarle a seguir las instrucciones sobre dnde y cmo usarlos. Las farmacias generalmente imprimen las instrucciones del medicamento slo en las cajas y no directamente en los tubos del [place].   Si su medicamento es muy caro, por favor, pngase en contacto con Rolm Gala llamando al [phone] y presione la opcin 4 o envenos un mensaje a travs de Clinical cytogeneticist.   No podemos decirle cul  ser su copago por los medicamentos por adelantado ya que esto es diferente dependiendo de la cobertura de su seguro. Sin embargo, es posible que podamos encontrar un medicamento sustituto a Audiological scientist un formulario para que el seguro cubra el medicamento que se considera necesario.   Si se requiere una autorizacin previa para que su compaa de seguros [place] su medicamento, por favor permtanos de 1 a 2 [address]  [address]  hbiles para completar este proceso.  Los precios de los medicamentos varan con frecuencia dependiendo del Environmental consultant de dnde se surte la receta y alguna farmacias pueden ofrecer precios ms baratos.  El sitio web www.goodrx.com tiene cupones para medicamentos de Health and safety inspector. Los precios aqu no tienen en cuenta lo que podra costar con la ayuda del seguro (puede ser ms barato con su seguro), pero el sitio web puede darle el precio si no utiliz Tourist information centre manager.  - Puede imprimir el cupn correspondiente y llevarlo con su receta a la farmacia.  - Tambin puede pasar por nuestra oficina durante el horario de atencin regular y Education officer, museum una tarjeta de cupones de GoodRx.  - Si necesita que su receta se enve electrnicamente a una farmacia diferente, informe a nuestra oficina a travs de MyChart de [place] o por telfono llamando al [phone] y presione la opcin 4.

## 2023-12-24 NOTE — Progress Notes (Signed)
Follow-Up Visit   Subjective  Barbara Rivers is a 68 y.o. female who presents for the following: Skin Cancer Screening and Full Body Skin Exam  The patient presents for Total-Body Skin Exam (TBSE) for skin cancer screening and mole check. The patient has spots, moles and lesions to be evaluated, some may be new or changing and the patient may have concern these could be cancer.  The following portions of the chart were reviewed this encounter and updated as appropriate: medications, allergies, medical history  Review of Systems:  No other skin or systemic complaints except as noted in HPI or Assessment and Plan.  Objective  Well appearing patient in no apparent distress; mood and affect are within normal limits.  A full examination was performed including scalp, head, eyes, ears, nose, lips, neck, chest, axillae, abdomen, back, buttocks, bilateral upper extremities, bilateral lower extremities, hands, feet, fingers, toes, fingernails, and toenails. All findings within normal limits unless otherwise noted below.   Relevant physical exam findings are noted in the Assessment and Plan. L forearm below elbow x 1 Pink brown firm scaly papule.  Assessment & Plan   SKIN CANCER SCREENING PERFORMED TODAY.  ACTINIC DAMAGE - Chronic condition, secondary to cumulative UV/sun exposure - diffuse scaly erythematous macules with underlying dyspigmentation - Recommend daily broad spectrum sunscreen SPF 30+ to sun-exposed areas, reapply every 2 hours as needed.  - Staying in the shade or wearing long sleeves, sun glasses (UVA+UVB protection) and wide brim hats (4-inch brim around the entire circumference of the hat) are also recommended for sun protection.  - Call for new or changing lesions.  LENTIGINES, SEBORRHEIC KERATOSES, HEMANGIOMAS - Benign normal skin lesions - Benign-appearing - Call for any changes  MELANOCYTIC NEVI - Tan-brown and/or pink-flesh-colored symmetric macules and  papules - Benign appearing on exam today - Observation - Call clinic for new or changing moles - Recommend daily use of broad spectrum spf 30+ sunscreen to sun-exposed areas.   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  - Call if any new or changing lesions are noted between office visits  HISTORY OF PRECANCEROUS ACTINIC KERATOSIS - R lower pretibia - site(s) of PreCancerous Actinic Keratosis clear today. - these may recur and new lesions may form requiring treatment to prevent transformation into skin cancer - observe for new or changing spots and contact [facility] for appointment if occur - photoprotection with sun protective clothing; sunglasses and broad spectrum sunscreen with SPF of at least 30 + and frequent self skin exams recommended - yearly exams by a dermatologist recommended for persons with history of PreCancerous Actinic Keratoses  FOLLICULITIS  Exam: Pink follicular papules on L upper back, occipital hairline, and post neck  Chronic and persistent condition with duration or expected duration over one year. Condition is symptomatic/ bothersome to patient. Not currently at goal.   Folliculitis occurs due to inflammation of the superficial hair follicle (pore), resulting in acne-like lesions (pus bumps). It can be infectious (bacterial, fungal) or noninfectious (shaving, tight clothing, heat/sweat, medications).  Folliculitis can be acute or chronic and recommended treatment depends on the underlying cause of folliculitis.  Treatment Plan: Start Fluocinonide solution to aa's QD-BID PRN.  Recommend OTC Head & Shoulders shampoo 2-3x per week, massage into scalp and let  sit 3-5 minutes before rinsing.  INFLAMED SEBORRHEIC KERATOSIS L forearm below elbow x 1 Vs  HyAK - Symptomatic, irritating, patient would like treated.  Destruction of lesion - L forearm below elbow x 1  Destruction method: cryotherapy   Informed consent: discussed and consent obtained   Lesion destroyed using liquid nitrogen: Yes   Region frozen until ice ball extended beyond lesion: Yes   Outcome: patient tolerated procedure well with no complications   Post-procedure details: wound care instructions given   Additional details:  Prior to procedure, discussed risks of blister formation, small wound, skin dyspigmentation, or rare scar following cryotherapy. Recommend Vaseline ointment to treated areas while healing.   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 Larger vessels may be covered if symptomatic and we would refer to vascular surgeon if treatment desired.  Acrochordons (Skin Tags) - axilla - Fleshy, skin-colored pedunculated papules - Benign appearing.  - Observe. - If desired, they can be removed with an in office procedure that is not covered by insurance. - Please call the clinic if you notice any new or changing lesions.  Return in about 1 year (around 12/23/2024) for TBSE.  Maylene Roes, CMA, am acting as scribe for [name], MD .   Documentation: I have reviewed the above documentation for accuracy and completeness, and I agree with the above.  [name], MD

## 2024-04-02 ENCOUNTER — Encounter: Payer: Self-pay | Admitting: Family Medicine

## 2024-04-02 ENCOUNTER — Ambulatory Visit: Payer: Self-pay | Admitting: Family Medicine

## 2024-04-02 VITALS — BP 122/80 | HR 88 | Temp 98.4°F | Resp 16 | Ht 64.0 in | Wt 182.0 lb

## 2024-04-02 DIAGNOSIS — E1169 Type 2 diabetes mellitus with other specified complication: Secondary | ICD-10-CM | POA: Diagnosis not present

## 2024-04-02 DIAGNOSIS — Z79899 Other long term (current) drug therapy: Secondary | ICD-10-CM | POA: Diagnosis not present

## 2024-04-02 DIAGNOSIS — I83811 Varicose veins of right lower extremities with pain: Secondary | ICD-10-CM | POA: Diagnosis not present

## 2024-04-02 DIAGNOSIS — E785 Hyperlipidemia, unspecified: Secondary | ICD-10-CM

## 2024-04-02 DIAGNOSIS — Z1231 Encounter for screening mammogram for malignant neoplasm of breast: Secondary | ICD-10-CM

## 2024-04-02 DIAGNOSIS — K219 Gastro-esophageal reflux disease without esophagitis: Secondary | ICD-10-CM | POA: Diagnosis not present

## 2024-04-02 LAB — POCT GLYCOSYLATED HEMOGLOBIN (HGB A1C): Hemoglobin A1C: 7.3 % — AB (ref 4.0–5.6)

## 2024-04-02 MED ORDER — METFORMIN HCL ER 500 MG PO TB24
500.0000 mg | ORAL_TABLET | Freq: Every evening | ORAL | 1 refills | Status: DC
Start: 2024-04-02 — End: 2024-06-03

## 2024-04-02 MED ORDER — EMPAGLIFLOZIN 25 MG PO TABS
25.0000 mg | ORAL_TABLET | Freq: Every day | ORAL | 1 refills | Status: DC
Start: 2024-04-02 — End: 2024-06-03

## 2024-04-02 NOTE — Progress Notes (Signed)
 Name: Barbara Rivers   MRN: [id number]    DOB: Jan 26, 1956   Date:04/02/2024       Progress Note  Subjective  Chief Complaint  Chief Complaint  Patient presents with   Medical Management of Chronic Issues   Discussed the use of AI scribe software for clinical note transcription with the patient, who gave verbal consent to proceed.  History of Present Illness Barbara Rivers is a 68 year old female with diabetes who presents for a diabetes management visit.  Since August 2022, her A1c levels have been consistently above 7%, with the most recent A1c at 7.3%, down from 7.8% in January. She takes metformin  but experiences lightheadedness when taking two doses a day, so she prefers to take one dose at night. She cannot tolerate an increased dose due to side effects such as fatigue and sluggishness. She manages her diet by avoiding foods high in sugar and carbohydrates Stress and recent life events have impacted her ability to maintain her health regimen.  She has a history of varicose veins, particularly in her right leg, which have become more pronounced with recent increased physical activity related to managing her mother's estate. She experiences swelling and discomfort, especially after prolonged periods of standing or sitting. She has not been using compression stockings regularly due to heat and discomfort.  She reports occasional reflux symptoms, which she manages by avoiding spicy foods. She takes Tums as needed for relief.  Her current medications include metformin  for diabetes, and she has previously used Zetia  for cholesterol management but stopped due to gastrointestinal side effects, but will resume it again. She is not willing to take statin due to family history of intolerance to statin therapy    Patient Active Problem List   Diagnosis Date Noted   Dyslipidemia due to type 2 diabetes mellitus (HCC) 08/28/2022   VV (varicose veins) 07/26/2016   Venous insufficiency of right leg  07/05/2016   Obesity (BMI 30.0-34.9) 07/05/2016   Chronic pain of right knee 05/31/2016   Post-traumatic osteoarthritis of right knee 05/31/2016   Quadriceps tendon rupture 05/31/2016   CRPS type II 05/31/2016   Muscle wasting and atrophy, not elsewhere classified, right thigh 05/31/2016    Past Surgical History:  Procedure Laterality Date   CERVICAL CONIZATION W/BX     KNEE ARTHROSCOPY  02/23/2012   Procedure: ARTHROSCOPY KNEE;  Surgeon: Boston Byers, MD;  Location: [facility];  Service: Orthopedics;  Laterality: Right;  Lateral Retinacular Release, Chondroplasty Medial Femoral Condyle and Patella, Partial Lateral Meniscectomy   QUADRICEPS TENDON REPAIR  11/15/2012   Procedure: REPAIR QUADRICEP TENDON;  Surgeon: Boston Byers, MD;  Location: [facility];  Service: Orthopedics;  Laterality: Right;  WITH ALLOGRAFT   TONSILLECTOMY      Family History  Problem Relation Age of Onset   Diabetes Mother    Hypertension Mother    Congestive Heart Failure Mother    Diabetes Father    Kidney disease Father    Hypertension Father    Cancer Maternal Grandmother 48       Breast   Breast cancer Maternal Grandmother 30   Diabetes Paternal Grandmother     Social History   Tobacco Use   Smoking status: Never   Smokeless tobacco: Never  Substance Use Topics   Alcohol use: Yes    Alcohol/week: 1.0 standard drink of alcohol    Types: 1 Glasses of wine per week    Comment: rarely  Current Outpatient Medications:    baclofen  (LIORESAL ) 10 MG tablet, Take 1 tablet (10 mg total) by mouth 3 (three) times daily as needed for muscle spasms., Disp: 90 tablet, Rfl: 0   diclofenac  Sodium (VOLTAREN ) 1 % GEL, Apply 4 g topically 4 (four) times daily., Disp: 300 g, Rfl: 1   empagliflozin (JARDIANCE) 25 MG TABS tablet, Take 1 tablet (25 mg total) by mouth daily before breakfast., Disp: 90 tablet, Rfl: 1   fluocinonide  (LIDEX ) 0.05 % external solution, Apply to aa's  scalp and neck QD-BID PRN., Disp: 60 mL, Rfl: 3   Multiple Vitamin (MULTIVITAMIN) tablet, Take 1 tablet by mouth daily., Disp: , Rfl:    ezetimibe  (ZETIA ) 10 MG tablet, Take 1 tablet (10 mg total) by mouth daily. (Patient not taking: Reported on 12/04/2023), Disp: 90 tablet, Rfl: 3   metFORMIN  (GLUCOPHAGE -XR) 500 MG 24 hr tablet, Take 1 tablet (500 mg total) by mouth every evening., Disp: 90 tablet, Rfl: 1   traMADol  (ULTRAM ) 50 MG tablet, Take 1 tablet (50 mg total) by mouth 3 times/day as needed-between meals & bedtime. (Patient not taking: Reported on 08/29/2023), Disp: 120 tablet, Rfl: 0  Allergies  Allergen Reactions   Penicillins Hives    I personally reviewed active problem list, medication list, allergies with the patient/caregiver today.   ROS  Ten systems reviewed and is negative except as mentioned in HPI Chronic right knee pain and antalgic tait  Objective Physical Exam Constitutional: Patient appears well-developed and well-nourished. Obese  No distress.  HEENT: head atraumatic, normocephalic, pupils equal and reactive to light, neck supple Cardiovascular: Normal rate, regular rhythm and normal heart sounds.  No murmur heard. No BLE edema. Varicose veins Right lower extremity Muscular Skeletal :  pain with rom of right knee  Pulmonary/Chest: Effort normal and breath sounds normal. No respiratory distress. Abdominal: Soft.  There is no tenderness. Psychiatric: Patient has a normal mood and affect. behavior is normal. Judgment and thought content normal.     Vitals:   04/02/24 1301  BP: 122/80  Pulse: 88  Resp: 16  Temp: 98.4 F (36.9 C)  TempSrc: Oral  SpO2: 99%  Weight: 182 lb (82.6 kg)  Height: 5\' 4"  (1.626 m)    Body mass index is 31.24 kg/m.   PHQ2/9:    04/02/2024    1:14 PM 12/04/2023    2:12 PM 08/29/2023   10:37 AM 06/05/2023    9:40 AM 02/15/2023    7:58 AM  Depression screen PHQ 2/9  Decreased Interest 0 0 0 0 1  Down, Depressed, Hopeless 0 0  0 0 0  PHQ - 2 Score 0 0 0 0 1  Altered sleeping 0 0 0 0 0  Tired, decreased energy 0 0 0 0 1  Change in appetite 0 0 0 0 0  Feeling bad or failure about yourself  0 0 0 0 0  Trouble concentrating 0 0 0 0 0  Moving slowly or fidgety/restless 0 0 0 0 0  Suicidal thoughts 0 0 0 0 0  PHQ-9 Score 0 0 0 0 2  Difficult doing work/chores Not difficult at all Not difficult at all   Not difficult at all    phq 9 is negative  Fall Risk:    04/02/2024    1:14 PM 12/04/2023    2:12 PM 08/29/2023   10:37 AM 06/05/2023    9:40 AM 02/15/2023    7:58 AM  Fall Risk   Falls in the past  year? 0 0 0 0 0  Number falls in past yr: 0 0  0   Injury with Fall? 0 0  0   Risk for fall due to : No Fall Risks No Fall Risks Impaired balance/gait;Orthopedic patient Impaired balance/gait No Fall Risks  Follow up Falls prevention discussed;Education provided;Falls evaluation completed Falls prevention discussed;Education provided;Falls evaluation completed Falls prevention discussed;Education provided;Falls evaluation completed Falls prevention discussed Falls prevention discussed      Assessment & Plan Type 2 diabetes mellitus, with associated dyslipidemia A1c remains above target at 7.3%. Lightheadedness with increased metformin  dosage and difficulty swallowing larger pills noted. Jardiance discussed for A1c reduction and renal protection, preferred by insurance, and suitable due to small pill size. Shared decision to initiate Jardiance with option to discontinue if not tolerated. - Continue metformin  500 mg once daily at night. - Initiate Jardiance 25 mg once daily. - Order blood work to monitor diabetes control. - Schedule follow-up in October to assess diabetes management.  Varicose veins of right lower extremity Symptoms exacerbated by physical activity and prolonged standing. Compression stockings and ice application discussed for symptom relief. Referral to vascular specialist considered if symptoms  persist. - Advise use of compression stockings during travel and prolonged standing. - Suggest ice application to reduce inflammation, if tolerated. - Consider referral to vascular specialist if symptoms persist by October.  Gastroesophageal reflux disease (GERD) GERD managed with dietary modifications, significantly reducing symptoms. Occasional use of Tums for breakthrough symptoms. - Continue dietary modifications to avoid spicy foods. - Use Tums as needed for breakthrough symptoms.

## 2024-04-03 ENCOUNTER — Ambulatory Visit: Payer: Self-pay | Admitting: Family Medicine

## 2024-04-03 LAB — MICROALBUMIN / CREATININE URINE RATIO
Creatinine, Urine: 20 mg/dL (ref 20–275)
Microalb Creat Ratio: 10 mg/g{creat} (ref <30)
Microalb, Ur: 0.2 mg/dL

## 2024-04-03 LAB — CBC WITH DIFFERENTIAL/PLATELET
Absolute Lymphocytes: 2182 {cells}/uL (ref 850–3900)
Absolute Monocytes: 639 {cells}/uL (ref 200–950)
Basophils Absolute: 62 {cells}/uL (ref 0–200)
Basophils Relative: 1 %
Eosinophils Absolute: 143 {cells}/uL (ref 15–500)
Eosinophils Relative: 2.3 %
HCT: 45.8 % — ABNORMAL HIGH (ref 35.0–45.0)
Hemoglobin: 14.7 g/dL (ref 11.7–15.5)
MCH: 29.5 pg (ref 27.0–33.0)
MCHC: 32.1 g/dL (ref 32.0–36.0)
MCV: 92 fL (ref 80.0–100.0)
MPV: 12.3 fL (ref 7.5–12.5)
Monocytes Relative: 10.3 %
Neutro Abs: 3174 {cells}/uL (ref 1500–7800)
Neutrophils Relative %: 51.2 %
Platelets: 338 10*3/uL (ref 140–400)
RBC: 4.98 10*6/uL (ref 3.80–5.10)
RDW: 12.3 % (ref 11.0–15.0)
Total Lymphocyte: 35.2 %
WBC: 6.2 10*3/uL (ref 3.8–10.8)

## 2024-04-03 LAB — LIPID PANEL
Cholesterol: 222 mg/dL — ABNORMAL HIGH (ref <200)
HDL: 84 mg/dL (ref >=50)
LDL Cholesterol (Calc): 112 mg/dL — ABNORMAL HIGH
Non-HDL Cholesterol (Calc): 138 mg/dL — ABNORMAL HIGH (ref <130)
Total CHOL/HDL Ratio: 2.6 (calc) (ref <5.0)
Triglycerides: 146 mg/dL (ref <150)

## 2024-04-03 LAB — COMPREHENSIVE METABOLIC PANEL WITH GFR
AG Ratio: 1.7 (calc) (ref 1.0–2.5)
ALT: 17 U/L (ref 6–29)
AST: 14 U/L (ref 10–35)
Albumin: 4.7 g/dL (ref 3.6–5.1)
Alkaline phosphatase (APISO): 131 U/L (ref 37–153)
BUN: 16 mg/dL (ref 7–25)
CO2: 26 mmol/L (ref 20–32)
Calcium: 9.9 mg/dL (ref 8.6–10.4)
Chloride: 103 mmol/L (ref 98–110)
Creat: 0.59 mg/dL (ref 0.50–1.05)
Globulin: 2.8 g/dL (ref 1.9–3.7)
Glucose, Bld: 117 mg/dL — ABNORMAL HIGH (ref 65–99)
Potassium: 4.7 mmol/L (ref 3.5–5.3)
Sodium: 138 mmol/L (ref 135–146)
Total Bilirubin: 0.3 mg/dL (ref 0.2–1.2)
Total Protein: 7.5 g/dL (ref 6.1–8.1)
eGFR: 99 mL/min/1.73m2

## 2024-06-02 ENCOUNTER — Encounter: Payer: Self-pay | Admitting: Family Medicine

## 2024-06-03 ENCOUNTER — Other Ambulatory Visit: Payer: Self-pay

## 2024-06-03 DIAGNOSIS — E785 Hyperlipidemia, unspecified: Secondary | ICD-10-CM

## 2024-06-03 MED ORDER — METFORMIN HCL ER 500 MG PO TB24: 500.0000 mg | ORAL_TABLET | Freq: Two times a day (BID) | ORAL | 0 refills | Status: DC

## 2024-08-04 ENCOUNTER — Encounter: Payer: Self-pay | Admitting: Family Medicine

## 2024-08-04 ENCOUNTER — Ambulatory Visit: Admitting: Family Medicine

## 2024-08-04 VITALS — BP 130/78 | HR 81 | Resp 16 | Ht 64.0 in | Wt 186.3 lb

## 2024-08-04 DIAGNOSIS — M545 Low back pain, unspecified: Secondary | ICD-10-CM

## 2024-08-04 DIAGNOSIS — L659 Nonscarring hair loss, unspecified: Secondary | ICD-10-CM | POA: Diagnosis not present

## 2024-08-04 DIAGNOSIS — E1169 Type 2 diabetes mellitus with other specified complication: Secondary | ICD-10-CM | POA: Diagnosis not present

## 2024-08-04 DIAGNOSIS — E785 Hyperlipidemia, unspecified: Secondary | ICD-10-CM | POA: Diagnosis not present

## 2024-08-04 DIAGNOSIS — Z79899 Other long term (current) drug therapy: Secondary | ICD-10-CM

## 2024-08-04 DIAGNOSIS — K219 Gastro-esophageal reflux disease without esophagitis: Secondary | ICD-10-CM

## 2024-08-04 LAB — POCT GLYCOSYLATED HEMOGLOBIN (HGB A1C): Hemoglobin A1C: 6.7 % — AB (ref 4.0–5.6)

## 2024-08-04 MED ORDER — DEXCOM G7 SENSOR MISC: 1.0000 | Freq: Every day | 5 refills | Status: DC

## 2024-08-04 MED ORDER — EZETIMIBE 10 MG PO TABS: 10.0000 mg | ORAL_TABLET | Freq: Every day | ORAL | 1 refills | Status: AC

## 2024-08-04 MED ORDER — METFORMIN HCL ER 500 MG PO TB24: 500.0000 mg | ORAL_TABLET | Freq: Two times a day (BID) | ORAL | 1 refills | Status: AC

## 2024-08-04 NOTE — Progress Notes (Signed)
 Name: Barbara Rivers   MRN: [id number]    DOB: 1955-12-30   Date:08/04/2024       Progress Note  Subjective  Chief Complaint  Chief Complaint  Patient presents with   Medical Management of Chronic Issues   Discussed the use of AI scribe software for clinical note transcription with the patient, who gave verbal consent to proceed.  History of Present Illness Barbara Rivers is a 68 year old female with type 2 diabetes who presents for follow-up of her diabetes management.  Her hemoglobin A1c has improved to 6.7 from previous values of 7.3, 7.8, and 7.4 over the past year. She attributes this improvement to taking metformin  at night, which she finds more tolerable. She takes two doses of metformin  at night, around 8 PM, after dinner.  She experiences lightheadedness and a flush of heat primarily in the afternoon. She initially thought these were due to hypoglycemia, but her blood sugar levels are normal during these episodes. She wonders if these symptoms could be related to her blood pressure or to her body adjusting to improved blood sugar levels.  She has a family history of diabetes, as her cousin also has diabetes and manages it with glimepiride. She dislikes finger pricks and inquires about using a continuous glucose monitor, though she is unsure about insurance coverage.  She reports significant stress over the past year due to her mother's illness and passing, which she believes may have contributed to her recent hair loss. She noticed the hair loss about a week ago when her hairdresser commented on it. She is concerned about stress or other factors like thyroid  issues contributing to this.  She has not experienced significant weight loss, noting a slight weight gain recently. She maintains her weight around 180-182 pounds at home, though it was recorded as 186 pounds during the visit.  She has a history of high cholesterol and previously took ezetimibe , which she stopped due to  gastrointestinal side effects when combined with metformin . She is considering restarting it now that she is more accustomed to the metformin .  She experiences occasional heartburn, which she manages by avoiding spicy foods and using lactate milk for relief.  She has varicose veins, particularly on the right side, which are more swollen when she feels inflamed or experiences temperature changes. She has not yet sought specialist evaluation for this.  She uses baclofen  for leg spasms, which occur at night, and finds relief with magnesium cream applied to her legs and feet. She believes this also helps her sleep better.    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/2016   Muscle wasting and atrophy, not elsewhere classified, right thigh 05/31/2016    Past Surgical History:  Procedure Laterality Date   CERVICAL CONIZATION W/BX     KNEE ARTHROSCOPY  02/23/2012   Procedure: ARTHROSCOPY KNEE;  Surgeon: Norleen LITTIE Gavel, MD;  Location: [facility];  Service: Orthopedics;  Laterality: Right;  Lateral Retinacular Release, Chondroplasty Medial Femoral Condyle and Patella, Partial Lateral Meniscectomy  QUADRICEPS TENDON REPAIR  11/15/2012   Procedure: REPAIR QUADRICEP TENDON;  Surgeon: Norleen LITTIE Gavel, MD;  Location: [facility];  Service: Orthopedics;  Laterality: Right;  WITH ALLOGRAFT   TONSILLECTOMY      Family History  Problem Relation Age of Onset   Diabetes Mother    Hypertension Mother     Congestive Heart Failure Mother    Diabetes Father    Kidney disease Father    Hypertension Father    Cancer Maternal Grandmother 29       Breast   Breast cancer Maternal Grandmother 59   Diabetes Paternal Grandmother     Social History   Tobacco Use   Smoking status: Never   Smokeless tobacco: Never  Substance Use Topics   Alcohol use: Yes    Alcohol/week: 1.0 standard drink of alcohol    Types: 1 Glasses of wine per week    Comment: rarely     Current Outpatient Medications:    baclofen  (LIORESAL ) 10 MG tablet, Take 1 tablet (10 mg total) by mouth 3 (three) times daily as needed for muscle spasms., Disp: 90 tablet, Rfl: 0   diclofenac  Sodium (VOLTAREN ) 1 % GEL, Apply 4 g topically 4 (four) times daily., Disp: 300 g, Rfl: 1   fluocinonide  (LIDEX ) 0.05 % external solution, Apply to aa's scalp and neck QD-BID PRN., Disp: 60 mL, Rfl: 3   metFORMIN  (GLUCOPHAGE -XR) 500 MG 24 hr tablet, Take 1 tablet (500 mg total) by mouth in the morning and at bedtime., Disp: 180 tablet, Rfl: 0   Multiple Vitamin (MULTIVITAMIN) tablet, Take 1 tablet by mouth daily., Disp: , Rfl:    traMADol  (ULTRAM ) 50 MG tablet, Take 1 tablet (50 mg total) by mouth 3 times/day as needed-between meals & bedtime., Disp: 120 tablet, Rfl: 0   ezetimibe  (ZETIA ) 10 MG tablet, Take 1 tablet (10 mg total) by mouth daily. (Patient not taking: Reported on 08/04/2024), Disp: 90 tablet, Rfl: 3  Allergies  Allergen Reactions   Penicillins Hives    I personally reviewed active problem list, medication list, allergies, family history with the patient/caregiver today.   ROS  Ten systems reviewed and is negative except as mentioned in HPI    Objective Physical Exam  CONSTITUTIONAL: Patient appears well-developed and well-nourished. No distress. HEENT: Head atraumatic, normocephalic, neck supple. CARDIOVASCULAR: Normal rate, regular rhythm and normal heart sounds. No murmur heard. No BLE edema. Varicose veins present on  right extremity. PULMONARY: Effort normal and breath sounds normal. No respiratory distress. ABDOMINAL: There is no tenderness or distention. MUSCULOSKELETAL: Normal gait. Without gross motor or sensory deficit. PSYCHIATRIC: Patient has a normal mood and affect. Behavior is normal. Judgment and thought content normal.  Vitals:   08/04/24 1322  BP: 130/78  Pulse: 81  Resp: 16  SpO2: 98%  Weight: 186 lb 4.8 oz (84.5 kg)  Height: 5' 4 (1.626 m)    Body mass index is 31.98 kg/m.  Recent Results (from the past 2160 hours)  POCT glycosylated hemoglobin (Hb A1C)     Status: Abnormal   Collection Time: 08/04/24  1:30 PM  Result Value Ref Range   Hemoglobin A1C 6.7 (A) 4.0 - 5.6 %   HbA1c POC (<> result, manual entry)     HbA1c, POC (prediabetic range)     HbA1c, POC (controlled diabetic range)        PHQ2/9:    08/04/2024    1:15 PM 04/02/2024    1:14 PM 12/04/2023  2:12 PM 08/29/2023   10:37 AM 06/05/2023    9:40 AM  Depression screen PHQ 2/9  Decreased Interest 0 0 0 0 0  Down, Depressed, Hopeless 0 0 0 0 0  PHQ - 2 Score 0 0 0 0 0  Altered sleeping  0 0 0 0  Tired, decreased energy  0 0 0 0  Change in appetite  0 0 0 0  Feeling bad or failure about yourself   0 0 0 0  Trouble concentrating  0 0 0 0  Moving slowly or fidgety/restless  0 0 0 0  Suicidal thoughts  0 0 0 0  PHQ-9 Score  0 0 0 0  Difficult doing work/chores  Not difficult at all Not difficult at all      phq 9 is negative  Fall Risk:    08/04/2024    1:15 PM 04/02/2024    1:14 PM 12/04/2023    2:12 PM 08/29/2023   10:37 AM 06/05/2023    9:40 AM  Fall Risk   Falls in the past year? 0 0 0 0 0  Number falls in past yr: 0 0 0  0  Injury with Fall? 0 0 0  0  Risk for fall due to : No Fall Risks No Fall Risks No Fall Risks Impaired balance/gait;Orthopedic patient Impaired balance/gait  Follow up Falls evaluation completed Falls prevention discussed;Education provided;Falls evaluation completed Falls  prevention discussed;Education provided;Falls evaluation completed Falls prevention discussed;Education provided;Falls evaluation completed Falls prevention discussed      Assessment & Plan Type 2 diabetes mellitus, well controlled A1c improved to 6.7. Symptoms of lightheadedness and heat flushes likely due to adjustment to lower blood sugar levels. - Continue metformin , 2 tablets at night. - Prescribe Dexcom 7 continuous glucose monitor. - Advise to monitor symptoms and consider timing of meals in relation to symptoms. - Discussed insurance coverage for continuous glucose monitor; advised to explore options if not covered.  Hyperlipidemia Previously managed with ezetimibe , stopped due to gastrointestinal side effects. Willing to retry ezetimibe . - Restart ezetimibe . - Advise to take ezetimibe  at night with other medications.  Varicose veins, right lower extremity Swelling and inflammation likely due to varicose veins. No specialist consultation yet.  Gastroesophageal reflux disease (GERD) Managed with dietary modifications and occasional Tums use. - Continue dietary modifications.  Muscle spasm of back Experiences spasms at night, relieved by baclofen  and magnesium cream. - Continue baclofen  for muscle spasms. - Continue using magnesium cream for symptom relief.  Hair loss, possible telogen effluvium Significant hair loss possibly due to stress or thyroid  dysfunction. - Order thyroid  function tests. - Order B12 and folic acid levels.

## 2024-08-05 ENCOUNTER — Ambulatory Visit: Payer: Self-pay | Admitting: Family Medicine

## 2024-08-05 LAB — B12 AND FOLATE PANEL
Folate: 13.4 ng/mL
Vitamin B-12: 900 pg/mL (ref 200–1100)

## 2024-08-05 LAB — TSH: TSH: 0.59 m[IU]/L (ref 0.40–4.50)

## 2024-08-19 ENCOUNTER — Encounter: Payer: Self-pay | Admitting: Family Medicine

## 2024-08-19 ENCOUNTER — Ambulatory Visit (INDEPENDENT_AMBULATORY_CARE_PROVIDER_SITE_OTHER): Payer: Self-pay | Admitting: Family Medicine

## 2024-08-19 VITALS — BP 128/78 | HR 87 | Resp 16 | Ht 64.0 in | Wt 186.6 lb

## 2024-08-19 DIAGNOSIS — R252 Cramp and spasm: Secondary | ICD-10-CM

## 2024-08-19 DIAGNOSIS — G8929 Other chronic pain: Secondary | ICD-10-CM | POA: Diagnosis not present

## 2024-08-19 DIAGNOSIS — M25561 Pain in right knee: Secondary | ICD-10-CM | POA: Diagnosis not present

## 2024-08-19 DIAGNOSIS — M62551 Muscle wasting and atrophy, not elsewhere classified, right thigh: Secondary | ICD-10-CM | POA: Diagnosis not present

## 2024-08-19 DIAGNOSIS — M1731 Unilateral post-traumatic osteoarthritis, right knee: Secondary | ICD-10-CM | POA: Diagnosis not present

## 2024-08-19 MED ORDER — TRAMADOL HCL 50 MG PO TABS: 50.0000 mg | ORAL_TABLET | Freq: Two times a day (BID) | ORAL | 1 refills | Status: AC | PRN

## 2024-08-19 MED ORDER — BACLOFEN 10 MG PO TABS: 10.0000 mg | ORAL_TABLET | Freq: Three times a day (TID) | ORAL | 3 refills | Status: AC | PRN

## 2024-08-19 MED ORDER — DICLOFENAC SODIUM 1 % EX GEL: 4.0000 g | Freq: Four times a day (QID) | CUTANEOUS | 1 refills | Status: AC

## 2024-08-19 NOTE — Progress Notes (Signed)
 Name: Barbara Rivers   MRN: [id number]    DOB: Feb 17, 1956   Date:08/19/2024       Progress Note  Subjective  Chief Complaint  Chief Complaint  Patient presents with   Knee Pain    Yearly check up   Discussed the use of AI scribe software for clinical note transcription with the patient, who gave verbal consent to proceed.  History of Present Illness  Unable to work secondary to pain and has social security disability. Injury happened 07/2011, settlement 2018. Surgery was in 2013 and quadriceps tendon repair 2014 after a rupture.   Barbara Rivers is a 68 year old female with posttraumatic osteoarthritis of the right knee who presents for a yearly evaluation and medication management.  She has posttraumatic osteoarthritis of the right knee and reports limited mobility. She reports pain levels of 5/10 at rest and 7/10 when walking. She uses a cane and a grocery cart for support when walking outside the house. She is managing her condition to avoid further surgeries due to concerns about scar tissue.  She has a history of a work-related injury that resulted in a quadriceps tendon rupture, requiring surgical repair with anchors and a cadaver tendon. She occasionally feels the anchors move, affecting her ability to walk. She experiences nerve pain and inflammation, for which she uses Voltaren , baclofen , and occasionally Advil. She previously used massage therapy but has stopped due to her therapist's illness. She now uses lymphedema boots and a handheld massager for relief.  She reports varicose veins in her right leg, which become painful when swollen. She uses an [employer] brace for support and has tried compression garments but finds them uncomfortable. She has not seen a vascular surgeon for this issue but would like to hold off for now on referral   Her current medications include baclofen  10 mg three times a day, Voltaren , and she occasionally uses tramadol  for severe pain. She also uses  Advil mini 200 mg as needed. She has experienced increased inflammation recently, which she attributes to the loss of her massage therapist and increased physical activity related to family obligations.    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/2016   Muscle wasting and atrophy, not elsewhere classified, right thigh 05/31/2016    Past Surgical History:  Procedure Laterality Date   CERVICAL CONIZATION W/BX     KNEE ARTHROSCOPY  02/23/2012   Procedure: ARTHROSCOPY KNEE;  Surgeon: Norleen LITTIE Gavel, MD;  Location: [facility];  Service: Orthopedics;  Laterality: Right;  Lateral Retinacular Release, Chondroplasty Medial Femoral Condyle and Patella, Partial Lateral Meniscectomy   QUADRICEPS TENDON REPAIR  11/15/2012   Procedure: REPAIR QUADRICEP TENDON;  Surgeon: Norleen LITTIE Gavel, MD;  Location: [facility];  Service: Orthopedics;  Laterality: Right;  WITH ALLOGRAFT   TONSILLECTOMY      Family History  Problem Relation Age of Onset   Diabetes Mother    Hypertension Mother    Congestive Heart Failure Mother    Diabetes Father  Kidney disease Father    Hypertension Father    Cancer Maternal Grandmother 84       Breast   Breast cancer Maternal Grandmother 61   Diabetes Paternal Grandmother     Social History   Tobacco Use   Smoking status: Never   Smokeless tobacco: Never  Substance Use Topics   Alcohol use: Yes    Alcohol/week: 1.0 standard drink of alcohol    Types: 1 Glasses of wine  per week    Comment: rarely     Current Outpatient Medications:    baclofen  (LIORESAL ) 10 MG tablet, Take 1 tablet (10 mg total) by mouth 3 (three) times daily as needed for muscle spasms., Disp: 90 tablet, Rfl: 0   Continuous Glucose Sensor (DEXCOM G7 SENSOR) MISC, 1 each by Does not apply route daily., Disp: 1 each, Rfl: 5   diclofenac  Sodium (VOLTAREN ) 1 % GEL, Apply 4 g topically 4 (four) times daily., Disp: 300 g, Rfl: 1   ezetimibe  (ZETIA ) 10 MG tablet, Take 1 tablet (10 mg total) by mouth daily., Disp: 90 tablet, Rfl: 1   fluocinonide  (LIDEX ) 0.05 % external solution, Apply to aa's scalp and neck QD-BID PRN., Disp: 60 mL, Rfl: 3   metFORMIN  (GLUCOPHAGE -XR) 500 MG 24 hr tablet, Take 1 tablet (500 mg total) by mouth in the morning and at bedtime., Disp: 180 tablet, Rfl: 1   Multiple Vitamin (MULTIVITAMIN) tablet, Take 1 tablet by mouth daily., Disp: , Rfl:    traMADol  (ULTRAM ) 50 MG tablet, Take 1 tablet (50 mg total) by mouth 3 times/day as needed-between meals & bedtime., Disp: 120 tablet, Rfl: 0  Allergies  Allergen Reactions   Penicillins Hives    I personally reviewed active problem list, medication list, allergies, family history with the patient/caregiver today.   ROS  Ten systems reviewed and is negative except as mentioned in HPI    Objective Physical Exam CONSTITUTIONAL: Patient appears well-developed and well-nourished.  No distress. HEENT: Head atraumatic, normocephalic, neck supple. CARDIOVASCULAR: Normal rate, regular rhythm and normal heart sounds.  No murmur heard. Varicose veins on right lower calf PULMONARY: Effort normal and breath sounds normal. No respiratory distress. ABDOMINAL: There is no tenderness or distention. MUSCULOSKELETAL: slow gait, using a cane, right knee with a lot of scars from previous surgery, atrophy of right quad PSYCHIATRIC: Patient has a normal mood and affect. behavior is normal. Judgment and thought content normal.  Vitals:    08/19/24 1318  BP: 128/78  Pulse: 87  Resp: 16  SpO2: 98%  Weight: 186 lb 9.6 oz (84.6 kg)  Height: 5' 4 (1.626 m)    Body mass index is 32.03 kg/m.  Recent Results (from the past 2160 hours)  POCT glycosylated hemoglobin (Hb A1C)     Status: Abnormal   Collection Time: 08/04/24  1:30 PM  Result Value Ref Range   Hemoglobin A1C 6.7 (A) 4.0 - 5.6 %   HbA1c POC (<> result, manual entry)     HbA1c, POC (prediabetic range)     HbA1c, POC (controlled diabetic range)    TSH     Status: None   Collection Time: 08/04/24  2:11 PM  Result Value Ref Range   TSH 0.59 0.40 - 4.50 mIU/L  B12 and Folate Panel     Status: None   Collection Time: 08/04/24  2:11 PM  Result Value Ref Range   Vitamin B-12 900 200 - 1,100 pg/mL   Folate 13.4 ng/mL    Comment:  Reference Range                            Low:           <3.4                            Borderline:    3.4-5.4                            Normal:        >5.4 .     PHQ2/9:    08/19/2024    1:15 PM 08/04/2024    1:15 PM 04/02/2024    1:14 PM 12/04/2023    2:12 PM 08/29/2023   10:37 AM  Depression screen PHQ 2/9  Decreased Interest 0 0 0 0 0  Down, Depressed, Hopeless 0 0 0 0 0  PHQ - 2 Score 0 0 0 0 0  Altered sleeping   0 0 0  Tired, decreased energy   0 0 0  Change in appetite   0 0 0  Feeling bad or failure about yourself    0 0 0  Trouble concentrating   0 0 0  Moving slowly or fidgety/restless   0 0 0  Suicidal thoughts   0 0 0  PHQ-9 Score   0 0 0  Difficult doing work/chores   Not difficult at all Not difficult at all     phq 9 is negative  Fall Risk:    08/19/2024    1:15 PM 08/04/2024    1:15 PM 04/02/2024    1:14 PM 12/04/2023    2:12 PM 08/29/2023   10:37 AM  Fall Risk   Falls in the past year? 0 0 0 0 0  Number falls in past yr: 0 0 0 0   Injury with Fall? 0 0 0 0   Risk for fall due to : No Fall Risks No Fall Risks No Fall Risks No Fall Risks Impaired balance/gait;Orthopedic  patient  Follow up Falls evaluation completed Falls evaluation completed Falls prevention discussed;Education provided;Falls evaluation completed Falls prevention discussed;Education provided;Falls evaluation completed Falls prevention discussed;Education provided;Falls evaluation completed     Assessment & Plan Posttraumatic osteoarthritis and chronic right quadriceps tendon rupture with muscle atrophy, effusion, and chronic pain of the right knee Chronic posttraumatic osteoarthritis and quadriceps tendon rupture with muscle atrophy, effusion, and chronic pain. Pain exacerbated by inflammation and cold weather. Limited range of motion. Previous surgical anchors may contribute to pain. - Prescribe baclofen  10 mg TID, 90 tablets with 3 refills. - Prescribe Voltaren  gel for topical use. - Prescribe tramadol  30 tablets with 1 refill for severe pain. - Advise continuation of lymphedema boots and knee sleeve. - Encourage pool exercises during warmer months and new steps when pool unavailable.  Muscle spasms of right quadriceps/muscle waisting right quad Muscle spasms in right quadriceps, particularly at night, managed with baclofen  and magnesium cream. Exacerbated by inflammation. - Continue baclofen  for muscle spasms. - Consider magnesium cream as adjunct.  Varicose veins of right lower extremity with complications Varicose veins in right lower extremity, likely secondary to trauma, causing swelling and discomfort. Referral to vascular surgeon discussed but deferred. - Continue lymphedema boots for swelling. - Defer referral to vascular surgeon until ready.  General Health Maintenance Discussed importance of flu vaccination and general health maintenance. - Advise  return for flu shot before November.

## 2024-08-21 NOTE — Progress Notes (Signed)
 Barbara Rivers                                          MRN: [id number]   08/21/2024   The [facility] Quality Team Specialist reviewed this patient medical record for the purposes of chart review for care gap closure. The following were reviewed: chart review for care gap closure-diabetic eye exam.    [facility] Quality Team

## 2024-08-27 ENCOUNTER — Other Ambulatory Visit: Payer: Self-pay | Admitting: Emergency Medicine

## 2024-08-27 DIAGNOSIS — E785 Hyperlipidemia, unspecified: Secondary | ICD-10-CM

## 2024-08-27 DIAGNOSIS — E119 Type 2 diabetes mellitus without complications: Secondary | ICD-10-CM

## 2024-08-29 ENCOUNTER — Ambulatory Visit: Payer: Self-pay | Admitting: Family Medicine

## 2024-09-04 ENCOUNTER — Ambulatory Visit

## 2024-09-04 DIAGNOSIS — Z0000 Encounter for general adult medical examination without abnormal findings: Secondary | ICD-10-CM

## 2024-09-04 NOTE — Progress Notes (Signed)
 Subjective:   Barbara Rivers is a 68 y.o. who presents for a Medicare Wellness preventive visit.  As a reminder, Annual Wellness Visits don't include a physical exam, and some assessments may be limited, especially if this visit is performed virtually. We may recommend an in-person follow-up visit with your provider if needed.  Visit Complete: Virtual I connected with  Barbara Rivers on 09/04/24 by a audio enabled telemedicine application and verified that I am speaking with the correct person using two identifiers.  Patient Location: Home  Provider Location: Home Office  I discussed the limitations of evaluation and management by telemedicine. The patient expressed understanding and agreed to proceed.  Vital Signs: Because this visit was a virtual/telehealth visit, some criteria may be missing or patient reported. Any vitals not documented were not able to be obtained and vitals that have been documented are patient reported.  VideoDeclined- This patient declined Librarian, academic. Therefore the visit was completed with audio only.  Persons Participating in Visit: Patient.  AWV Questionnaire: No: Patient Medicare AWV questionnaire was not completed prior to this visit.  Cardiac Risk Factors include: advanced age (>60men, >51 women);diabetes mellitus;obesity (BMI >30kg/m2);dyslipidemia     Objective:    Today's Vitals   09/04/24 1402  PainSc: 4    There is no height or weight on file to calculate BMI.     09/04/2024    2:13 PM 12/28/2022    1:34 PM 12/27/2021    1:40 PM 11/11/2020    1:58 PM 06/17/2019    3:57 PM 07/30/2017    1:54 PM 09/18/2016    2:33 PM  Advanced Directives  Does Patient Have a Medical Advance Directive? No Yes Yes Yes Yes No  No   Type of Best Boy of [place];Living will Healthcare Power of [place];Living will Healthcare Power of [place];Living will    Copy of Healthcare Power of Attorney in  Chart?   No - copy requested No - copy requested No - copy requested     Would patient like information on creating a medical advance directive? No - Patient declined     No - Patient declined  No - patient declined information      Data saved with a previous flowsheet row definition    Current Medications (verified) Outpatient Encounter Medications as of 09/04/2024  Medication Sig   baclofen  (LIORESAL ) 10 MG tablet Take 1 tablet (10 mg total) by mouth 3 (three) times daily as needed for muscle spasms.   Continuous Glucose Sensor (DEXCOM G7 SENSOR) MISC 1 each by Does not apply route daily.   diclofenac  Sodium (VOLTAREN ) 1 % GEL Apply 4 g topically 4 (four) times daily.   ezetimibe  (ZETIA ) 10 MG tablet Take 1 tablet (10 mg total) by mouth daily.   fluocinonide  (LIDEX ) 0.05 % external solution Apply to aa's scalp and neck QD-BID PRN.   ibuprofen (ADVIL) 200 MG tablet Take 400 mg by mouth every 6 (six) hours as needed.   metFORMIN  (GLUCOPHAGE -XR) 500 MG 24 hr tablet Take 1 tablet (500 mg total) by mouth in the morning and at bedtime.   Multiple Vitamin (MULTIVITAMIN) tablet Take 1 tablet by mouth daily.   traMADol  (ULTRAM ) 50 MG tablet Take 1 tablet (50 mg total) by mouth 3 times/day as needed-between meals & bedtime.   No facility-administered encounter medications on file as of 09/04/2024.    Allergies (verified) Penicillins   History: Past Medical History:  Diagnosis Date  Actinic keratosis 02/14/2023   R lower pretibia   Arthritis    right knee   Basal cell carcinoma 2002   right medial infraocular, tx by Dr Jakie in the past.   Disorder of jaw    occ. becomes tight and locks with dental procedures   Immature cataract    left eye   Quadriceps tendon rupture 11/06/2012   right   Past Surgical History:  Procedure Laterality Date   CERVICAL CONIZATION W/BX     KNEE ARTHROSCOPY  02/23/2012   Procedure: ARTHROSCOPY KNEE;  Surgeon: Norleen LITTIE Gavel, MD;  Location: [facility];  Service: Orthopedics;  Laterality: Right;  Lateral Retinacular Release, Chondroplasty Medial Femoral Condyle and Patella, Partial Lateral Meniscectomy   QUADRICEPS TENDON REPAIR  11/15/2012   Procedure: REPAIR QUADRICEP TENDON;  Surgeon: Norleen LITTIE Gavel, MD;  Location: [facility];  Service: Orthopedics;  Laterality: Right;  WITH ALLOGRAFT   TONSILLECTOMY     Family History  Problem Relation Age of Onset   Diabetes Mother    Hypertension Mother    Congestive Heart Failure Mother    Diabetes Father    Kidney disease Father    Hypertension Father    Cancer Maternal Grandmother 37       Breast   Breast cancer Maternal Grandmother 74   Diabetes Paternal Grandmother    Social History   Socioeconomic History   Marital status: Married    Spouse name: Zachary   Number of children: 1   Years of education: Not on file   Highest education level: Not on file  Occupational History   Occupation: disabled    Comment: workman's comp   Tobacco Use   Smoking status: Never   Smokeless tobacco: Never  Vaping Use   Vaping status: Never Used  Substance and Sexual Activity   Alcohol use: Yes    Alcohol/week: 1.0 standard drink of alcohol    Types: 1 Glasses of wine per week    Comment: rarely   Drug use: No   Sexual activity: Never  Other Topics Concern   Not on file  Social History Narrative   She used to work but since 2013 , had a injury at work and one year later they found out that she had quad surgery, she still has weakness , daily pain and uses a cane. Fully disabled.    Husband also had a stroke in 2015  and works from home now    Social Drivers of [employer]: Low Risk  (09/04/2024)   Overall Financial Resource Strain (CARDIA)    Difficulty of Paying Living Expenses: Not hard at all  Food Insecurity: No Food Insecurity (09/04/2024)   Hunger Vital Sign    Worried About Running Out of Food in the Last Year: Never true    Ran  Out of Food in the Last Year: Never true  Transportation Needs: No Transportation Needs (09/04/2024)   PRAPARE - Administrator, Civil Service (Medical): No    Lack of Transportation (Non-Medical): No  Physical Activity: Sufficiently Active (09/04/2024)   Exercise Vital Sign    Days of Exercise per Week: 7 days    Minutes of Exercise per Session: 30 min  Stress: No Stress Concern Present (09/04/2024)   [employer] of Occupational Health - Occupational Stress Questionnaire    Feeling of Stress: Only a little  Social Connections: Moderately Isolated (09/04/2024)   Social Connection and Isolation Panel  Frequency of Communication with Friends and Family: More than three times a week    Frequency of Social Gatherings with Friends and Family: Once a week    Attends Religious Services: Never    Database Administrator or Organizations: No    Attends Engineer, Structural: Never    Marital Status: Married    Tobacco Counseling Counseling given: Not Answered    Clinical Intake:  Pre-visit preparation completed: Yes  Pain : 0-10 Pain Score: 4  Pain Type: Chronic pain Pain Location: Knee Pain Orientation: Right Pain Descriptors / Indicators: Aching, Constant Pain Onset: More than a month ago Pain Frequency: Constant Pain Relieving Factors: ADVIL, TRAMADOL , HEAT, BACLOFEN   Pain Relieving Factors: ADVIL, TRAMADOL , HEAT, BACLOFEN   BMI - recorded: 31.9 Nutritional Status: BMI > 30  Obese Nutritional Risks: None Diabetes: Yes CBG done?: No  Lab Results  Component Value Date   HGBA1C 6.7 (A) 08/04/2024   HGBA1C 7.3 (A) 04/02/2024   HGBA1C 7.8 (A) 12/04/2023     How often do you need to have someone help you when you read instructions, pamphlets, or other written materials from your doctor or pharmacy?: 1 - Never  Interpreter Needed?: No  Information entered by :: Barbara DAS, LPN   Activities of Daily Living     09/04/2024    2:16 PM  In  your present state of health, do you have any difficulty performing the following activities:  Hearing? 0  Vision? 0  Difficulty concentrating or making decisions? 0  Walking or climbing stairs? 1  Comment RIGHT LEG PAIN  Dressing or bathing? 0  Doing errands, shopping? 0  Preparing Food and eating ? N  Using the Toilet? N  In the past six months, have you accidently leaked urine? N  Do you have problems with loss of bowel control? N  Managing your Medications? N  Managing your Finances? N  Housekeeping or managing your Housekeeping? N    Patient Care Team: Sowles, Krichna, MD as PCP - General (Family Medicine) Mittie Gaskin, MD as Referring Physician (Ophthalmology)  I have updated your Care Teams any recent Medical Services you may have received from other providers in the past year.     Assessment:   This is a routine wellness examination for Barbara Rivers.  Hearing/Vision screen Hearing Screening - Comments:: NO AIDS Vision Screening - Comments:: [facility]- DR.BRASINGTON- SEEN EVERY OTHER YEAR   Goals Addressed             This Visit's Progress    DIET - EAT MORE FRUITS AND VEGETABLES         Depression Screen       09/04/2024    2:09 PM 08/19/2024    1:15 PM 08/04/2024    1:15 PM 04/02/2024    1:14 PM 12/04/2023    2:12 PM 08/29/2023   10:37 AM 06/05/2023    9:40 AM  PHQ 2/9 Scores  PHQ - 2 Score 2 0 0 0 0 0 0  PHQ- 9 Score 3   0 0 0 0    Fall Risk     09/04/2024    2:15 PM 08/19/2024    1:15 PM 08/04/2024    1:15 PM 04/02/2024    1:14 PM 12/04/2023    2:12 PM  Fall Risk   Falls in the past year? 0 0 0 0 0  Number falls in past yr: 0 0 0 0 0  Injury with Fall? 0 0 0 0 0  Risk for fall due to : No Fall Risks No Fall Risks No Fall Risks No Fall Risks No Fall Risks  Follow up Falls evaluation completed;Falls prevention discussed Falls evaluation completed Falls evaluation completed Falls prevention discussed;Education provided;Falls evaluation completed  Falls prevention discussed;Education provided;Falls evaluation completed    MEDICARE RISK AT HOME:  Medicare Risk at Home Any stairs in or around the home?: Yes If so, are there any without handrails?: No Home free of loose throw rugs in walkways, pet beds, electrical cords, etc?: Yes Adequate lighting in your home to reduce risk of falls?: Yes Life alert?: No Use of a cane, walker or w/c?: Yes (CANE WHEN OUT OF HOUSE- WALKER WHEN GETTING OUT OF BED & COLD WEATHER) Grab bars in the bathroom?: Yes Shower chair or bench in shower?: Yes Elevated toilet seat or a handicapped toilet?: Yes  TIMED UP AND GO:  Was the test performed?  No  Cognitive Function: 6CIT completed        09/04/2024    2:21 PM 12/28/2022    1:43 PM  6CIT Screen  What Year? 0 points 0 points  What month? 0 points 0 points  What time? 0 points 0 points  Count back from 20 0 points 0 points  Months in reverse 0 points 0 points  Repeat phrase 0 points 0 points  Total Score 0 points 0 points    Immunizations Immunization History  Administered Date(s) Administered   Influenza Inj Mdck Quad Pf 09/16/2019   Influenza,inj,Quad PF,6+ Mos 09/18/2016, 09/25/2017, 09/11/2018   PNEUMOCOCCAL CONJUGATE-20 06/07/2021   Tdap 06/07/2021   Zoster Recombinant(Shingrix) 03/25/2018, 08/22/2018    Screening Tests Health Maintenance  Topic Date Due   OPHTHALMOLOGY EXAM  08/04/2023   Mammogram  07/23/2024   Influenza Vaccine  02/03/2025 (Originally 06/06/2024)   FOOT EXAM  12/03/2024   HEMOGLOBIN A1C  02/01/2025   Diabetic kidney evaluation - eGFR measurement  04/02/2025   Diabetic kidney evaluation - Urine ACR  04/02/2025   DEXA SCAN  07/23/2025   Medicare Annual Wellness (AWV)  09/04/2025   Fecal DNA (Cologuard)  10/12/2025   DTaP/Tdap/Td (2 - Td or Tdap) 06/08/2031   Pneumococcal Vaccine: 50+ Years  Completed   Hepatitis C Screening  Completed   Zoster Vaccines- Shingrix  Completed   Meningococcal B Vaccine  Aged  Out   COVID-19 Vaccine  Discontinued    Health Maintenance Items Addressed: DECLINES FLU; ORDERED MAMMOGRAM IN MAY; UTD ON BDS & COLOGUARD  Additional Screening:  Vision Screening: Recommended annual ophthalmology exams for early detection of glaucoma and other disorders of the eye. Is the patient up to date with their annual eye exam?  Yes  Who is the provider or what is the name of the office in which the patient attends annual eye exams? DR.BRASINGTON  Dental Screening: Recommended annual dental exams for proper oral hygiene  Community Resource Referral / Chronic Care Management: CRR required this visit?  No   CCM required this visit?  No   Plan:    I have personally reviewed and noted the following in the patient's chart:   Medical and social history Use of alcohol, tobacco or illicit drugs  Current medications and supplements including opioid prescriptions. Patient is not currently taking opioid prescriptions. Functional ability and status Nutritional status Physical activity Advanced directives List of other physicians Hospitalizations, surgeries, and ER visits in previous 12 months Vitals Screenings to include cognitive, depression, and falls Referrals and appointments  In addition, I have  reviewed and discussed with patient certain preventive protocols, quality metrics, and best practice recommendations. A written personalized care plan for preventive services as well as general preventive health recommendations were provided to patient.   Barbara GORMAN Das, LPN   [id number]   After Visit Summary: (MyChart) Due to this being a telephonic visit, the after visit summary with patients personalized plan was offered to patient via MyChart   Notes: Nothing significant to report at this time.

## 2024-09-04 NOTE — Patient Instructions (Addendum)
 Ms. Rathod,  Thank you for taking the time for your Medicare Wellness Visit. I appreciate your continued commitment to your health goals. Please review the care plan we discussed, and feel free to reach out if I can assist you further.  Medicare recommends these wellness visits once per year to help you and your care team stay ahead of potential health issues. These visits are designed to focus on prevention, allowing your provider to concentrate on managing your acute and chronic conditions during your regular appointments.  Please note that Annual Wellness Visits do not include a physical exam. Some assessments may be limited, especially if the visit was conducted virtually. If needed, we may recommend a separate in-person follow-up with your provider.  Ongoing Care Seeing your primary care provider every 3 to 6 months helps us  monitor your health and provide consistent, personalized care.   Referrals If a referral was made during today's visit and you haven't received any updates within two weeks, please contact the referred provider directly to check on the status.  Recommended Screenings:  Health Maintenance  Topic Date Due   Eye exam for diabetics  08/04/2023   Breast Cancer Screening  07/23/2024   Flu Shot  02/03/2025*   Complete foot exam   12/03/2024   Hemoglobin A1C  02/01/2025   Yearly kidney function blood test for diabetes  04/02/2025   Yearly kidney health urinalysis for diabetes  04/02/2025   DEXA scan (bone density measurement)  07/23/2025   Medicare Annual Wellness Visit  09/04/2025   Cologuard (Stool DNA test)  10/12/2025   DTaP/Tdap/Td vaccine (2 - Td or Tdap) 06/08/2031   Pneumococcal Vaccine for age over 65  Completed   Hepatitis C Screening  Completed   Zoster (Shingles) Vaccine  Completed   Meningitis B Vaccine  Aged Out   COVID-19 Vaccine  Discontinued  *Topic was postponed. The date shown is not the original due date.    Advance Care Planning is  important because it: Ensures you receive medical care that aligns with your values, goals, and preferences. Provides guidance to your family and loved ones, reducing the emotional burden of decision-making during critical moments.  Vision: Annual vision screenings are recommended for early detection of glaucoma, cataracts, and diabetic retinopathy. These exams can also reveal signs of chronic conditions such as diabetes and high blood pressure.  Dental: Annual dental screenings help detect early signs of oral cancer, gum disease, and other conditions linked to overall health, including heart disease and diabetes.  Please see the attached documents for additional preventive care recommendations.   NEXT AWV 09/10/25 @ 12:40 PM BY VIDEO

## 2024-09-08 ENCOUNTER — Telehealth: Payer: Self-pay

## 2024-09-08 NOTE — Telephone Encounter (Signed)
 Copied from CRM [id number]. Topic: General - Other >> Sep 08, 2024  9:32 AM Joesph NOVAK wrote: Reason for CRM: Patient is calling to get a update on the form she dropped off 09/04/24. Renewal of disability placard. She would like a phone call when its completed. [phone].

## 2024-09-09 NOTE — Telephone Encounter (Signed)
 Pt notified form ready to be picked up except between 12-1pm since we are closed for lunch.

## 2024-09-22 ENCOUNTER — Other Ambulatory Visit: Payer: Self-pay | Admitting: Family Medicine

## 2024-09-22 DIAGNOSIS — E785 Hyperlipidemia, unspecified: Secondary | ICD-10-CM

## 2024-09-24 NOTE — Telephone Encounter (Signed)
 Too soon for refill, LRF 08/04/24 FOR 90 AND 1 RF.  Requested Prescriptions  Pending Prescriptions Disp Refills   metFORMIN  (GLUCOPHAGE -XR) 500 MG 24 hr tablet [Pharmacy Med Name: METFORMIN  HCL ER 500 MG TABLET] 90 tablet     Sig: TAKE 1 TABLET BY MOUTH EVERY EVENING     Endocrinology:  Diabetes - Biguanides Passed - 09/24/2024 12:01 PM      Passed - Cr in normal range and within 360 days    Creat  Date Value Ref Range Status  04/02/2024 0.59 0.50 - 1.05 mg/dL Final   Creatinine, Urine  Date Value Ref Range Status  04/02/2024 20 20 - 275 mg/dL Final         Passed - HBA1C is between 0 and 7.9 and within 180 days    Hemoglobin A1C  Date Value Ref Range Status  08/04/2024 6.7 (A) 4.0 - 5.6 % Final   Hgb A1c MFr Bld  Date Value Ref Range Status  01/30/2023 7.7 (H) <5.7 % of total Hgb Final    Comment:    For someone without known diabetes, a hemoglobin A1c value of 6.5% or greater indicates that they may have  diabetes and this should be confirmed with a follow-up  test. . For someone with known diabetes, a value <7% indicates  that their diabetes is well controlled and a value  greater than or equal to 7% indicates suboptimal  control. A1c targets should be individualized based on  duration of diabetes, age, comorbid conditions, and  other considerations. . Currently, no consensus exists regarding use of hemoglobin A1c for diagnosis of diabetes for children. .          Passed - eGFR in normal range and within 360 days    GFR, Est African American  Date Value Ref Range Status  12/07/2020 98 > OR = 60 mL/min/1.56m2 Final   GFR, Est Non African American  Date Value Ref Range Status  12/07/2020 85 > OR = 60 mL/min/1.74m2 Final   eGFR  Date Value Ref Range Status  04/02/2024 99 > OR = 60 mL/min/1.55m2 Final         Passed - B12 Level in normal range and within 720 days    Vitamin B-12  Date Value Ref Range Status  08/04/2024 900 200 - 1,100 pg/mL Final          Passed - Valid encounter within last 6 months    Recent Outpatient Visits           1 month ago Post-traumatic osteoarthritis of right knee   [facility] [place], Dorette, MD   1 month ago Dyslipidemia due to type 2 diabetes mellitus [facility])   [place] [facility] [place], Dorette, MD   5 months ago Dyslipidemia due to type 2 diabetes mellitus [facility])   [place] [facility] [place], Dorette, MD              Passed - CBC within normal limits and completed in the last 12 months    WBC  Date Value Ref Range Status  04/02/2024 6.2 3.8 - 10.8 Thousand/uL Final   RBC  Date Value Ref Range Status  04/02/2024 4.98 3.80 - 5.10 Million/uL Final   Hemoglobin  Date Value Ref Range Status  04/02/2024 14.7 11.7 - 15.5 g/dL Final   HCT  Date Value Ref Range Status  04/02/2024 45.8 (H) 35.0 - 45.0 % Final   MCHC  Date Value Ref  Range Status  04/02/2024 32.1 32.0 - 36.0 g/dL Final    Comment:    For adults, a slight decrease in the calculated MCHC value (in the range of 30 to 32 g/dL) is most likely not clinically significant; however, it should be interpreted with caution in correlation with other red cell parameters and the patient's clinical condition.    [facility]  Date Value Ref Range Status  04/02/2024 29.5 27.0 - 33.0 pg Final   MCV  Date Value Ref Range Status  04/02/2024 92.0 80.0 - 100.0 fL Final   No results found for: PLTCOUNTKUC, LABPLAT, POCPLA RDW  Date Value Ref Range Status  04/02/2024 12.3 11.0 - 15.0 % Final

## 2024-09-30 ENCOUNTER — Other Ambulatory Visit: Payer: Self-pay | Admitting: Family Medicine

## 2024-09-30 DIAGNOSIS — E785 Hyperlipidemia, unspecified: Secondary | ICD-10-CM

## 2024-10-01 ENCOUNTER — Other Ambulatory Visit: Payer: Self-pay | Admitting: Family Medicine

## 2024-10-01 DIAGNOSIS — E1169 Type 2 diabetes mellitus with other specified complication: Secondary | ICD-10-CM

## 2024-10-01 NOTE — Telephone Encounter (Signed)
 Second request

## 2024-10-06 NOTE — Telephone Encounter (Signed)
 Requested Prescriptions  Pending Prescriptions Disp Refills   Continuous Glucose Sensor (DEXCOM G7 SENSOR) MISC [Pharmacy Med Name: DEXCOM G7 SENSOR] 3 each 0    Sig: APPLY SENSOR TO SKIN FOR CONTINUOUS BLOOD SUGAR MONITORING, REPLACE EVERY 10 DAYS     Endocrinology: Diabetes - Testing Supplies Passed - 10/06/2024  3:44 PM      Passed - Valid encounter within last 12 months    Recent Outpatient Visits           1 month ago Post-traumatic osteoarthritis of right knee   [facility] Glenard Mire, MD   2 months ago Dyslipidemia due to type 2 diabetes mellitus [facility])   [facility] Glenard Mire, MD   6 months ago Dyslipidemia due to type 2 diabetes mellitus [facility])   [facility] [place], Krichna, MD

## 2024-10-28 NOTE — Progress Notes (Signed)
 Barbara Rivers                                          MRN: [id number]   10/28/2024   The [facility] Quality Team Specialist reviewed this patient medical record for the purposes of chart review for care gap closure. The following were reviewed: chart review for care gap closure-diabetic eye exam.    [facility]BCI Quality Team

## 2024-11-03 ENCOUNTER — Other Ambulatory Visit: Payer: Self-pay | Admitting: Family Medicine

## 2024-11-03 ENCOUNTER — Encounter: Payer: Self-pay | Admitting: Family Medicine

## 2024-11-03 DIAGNOSIS — E1169 Type 2 diabetes mellitus with other specified complication: Secondary | ICD-10-CM

## 2024-11-03 MED ORDER — DEXCOM G7 SENSOR MISC: 1.0000 | 5 refills | Status: AC

## 2024-11-18 LAB — OPHTHALMOLOGY REPORT-SCANNED

## 2024-11-25 NOTE — Progress Notes (Signed)
 Barbara Rivers                                          MRN: [id number]   11/25/2024   The [facility]BCI Quality Team Specialist reviewed this patient medical record for the purposes of chart review for care gap closure. The following were reviewed: chart review for care gap closure-diabetic eye exam.    [facility]BCI Quality Team

## 2024-12-02 ENCOUNTER — Other Ambulatory Visit (HOSPITAL_COMMUNITY): Payer: Self-pay

## 2024-12-05 ENCOUNTER — Encounter: Payer: Self-pay | Admitting: Family Medicine

## 2024-12-08 ENCOUNTER — Telehealth: Payer: Self-pay | Admitting: Pharmacy Technician

## 2024-12-08 ENCOUNTER — Other Ambulatory Visit (HOSPITAL_COMMUNITY): Payer: Self-pay

## 2024-12-08 NOTE — Telephone Encounter (Signed)
 PA request has been Received. New Encounter has been or will be created for follow up. For additional info see Pharmacy Prior Auth telephone encounter from 12/08/24.

## 2024-12-08 NOTE — Telephone Encounter (Signed)
 To help increase the likelihood of successful prior authorization for a continuous glucose monitor, please review the documentation and include evidence of at least two hypoglycemic events (insurers often require glucose values <=54 mg/dL) and/or clear documentation that the patient relies on insulin to adequately control their diabetes. Thank you for your partnership.

## 2024-12-10 ENCOUNTER — Ambulatory Visit: Payer: Self-pay

## 2024-12-10 NOTE — Telephone Encounter (Signed)
 Pt notified ins. Denied since not on insulin 

## 2024-12-10 NOTE — Telephone Encounter (Signed)
 FYI Only or Action Required?: Action required by provider: clinical question for provider.  Patient was last seen in primary care on 08/19/2024 by Glenard Mire, MD.  Called Nurse Triage reporting Medication Problem.   Triage Disposition: Call PCP When Office is Open  Patient/caregiver understands and will follow disposition?: Yes   Copied from CRM #[id number]. Topic: Clinical - Medication Question >> Dec 10, 2024 10:41 AM Ivette P wrote: Reason for CRM: Pt would like a follow up with the Dexcom 7 is denied or approved. Please give an update once outcome.   Pls follow up with [phone]    Reason for Disposition  [1] Caller requesting NON-URGENT health information AND [2] PCP's office is the best resource  Answer Assessment - Initial Assessment Questions 1. REASON FOR CALL: What is the main reason for your call? or How can I best help you?     My chart communication from 12/08/24 regarding PA process but no update on status of PA. Will route to clinic as this would be more appropriate for staff actively working on [place].  Protocols used: Information Only Call - No Triage-A-AH

## 2025-01-06 ENCOUNTER — Encounter: Payer: Medicare HMO | Admitting: Dermatology

## 2025-02-02 ENCOUNTER — Ambulatory Visit: Admitting: Family Medicine

## 2025-02-05 ENCOUNTER — Encounter

## 2025-08-19 ENCOUNTER — Ambulatory Visit: Admitting: Family Medicine

## 2025-09-10 ENCOUNTER — Ambulatory Visit
